# Patient Record
Sex: Female | Born: 1990 | Race: Black or African American | Hispanic: No | Marital: Single | Smoking: Former smoker
Health system: Southern US, Community
[De-identification: ages and names within clinical notes are randomized; demographics above are authoritative.]

## PROBLEM LIST (undated history)

## (undated) DIAGNOSIS — I10 Essential (primary) hypertension: Secondary | ICD-10-CM

## (undated) DIAGNOSIS — N939 Abnormal uterine and vaginal bleeding, unspecified: Secondary | ICD-10-CM

## (undated) HISTORY — DX: Essential (primary) hypertension: I10

## (undated) HISTORY — PX: NO PAST SURGERIES: SHX2092

## (undated) HISTORY — DX: Abnormal uterine and vaginal bleeding, unspecified: N93.9

---
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
## 2009-03-29 ENCOUNTER — Emergency Department: Payer: Self-pay | Admitting: Emergency Medicine

## 2009-03-31 ENCOUNTER — Emergency Department: Payer: Self-pay | Admitting: Emergency Medicine

## 2009-04-30 ENCOUNTER — Emergency Department: Payer: Self-pay | Admitting: Unknown Physician Specialty

## 2009-06-10 ENCOUNTER — Emergency Department: Payer: Self-pay | Admitting: Emergency Medicine

## 2009-07-05 ENCOUNTER — Emergency Department: Payer: Self-pay | Admitting: Emergency Medicine

## 2009-09-23 ENCOUNTER — Emergency Department: Payer: Self-pay | Admitting: Emergency Medicine

## 2010-04-29 ENCOUNTER — Emergency Department: Payer: Self-pay | Admitting: Emergency Medicine

## 2010-11-01 IMAGING — CT CT HEAD WITHOUT CONTRAST
2 series · 16 of 30 positions shown, 20 images · non-contrast
Comparison: none

REASON FOR EXAM: ha syncope
COMMENTS:   May transport without cardiac monitor

PROCEDURE:     CT  - CT HEAD WITHOUT CONTRAST  - April 30, 2009  [DATE]
RESULT:     Technique: Helical 5mm sections were obtained from the skull
base to the vertex without administration of intravenous contrast.

[Series 2: without · axial · non-contrast · 0.46mm/px · z∈[-216,-96]mm · 13 of 30 slices shown, 17 images]
[im 3/30  brain]
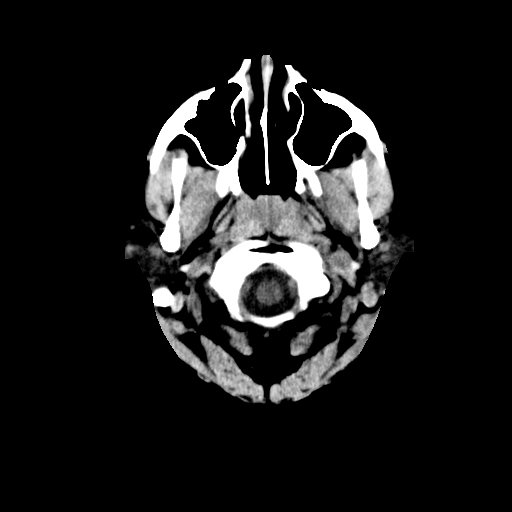
[im 3/30  bone]
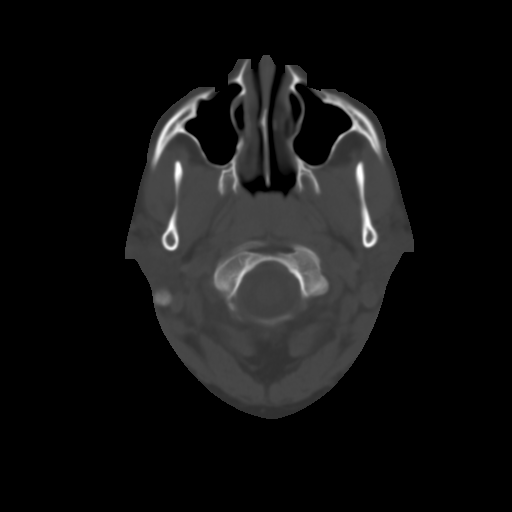
[im 5/30  brain]
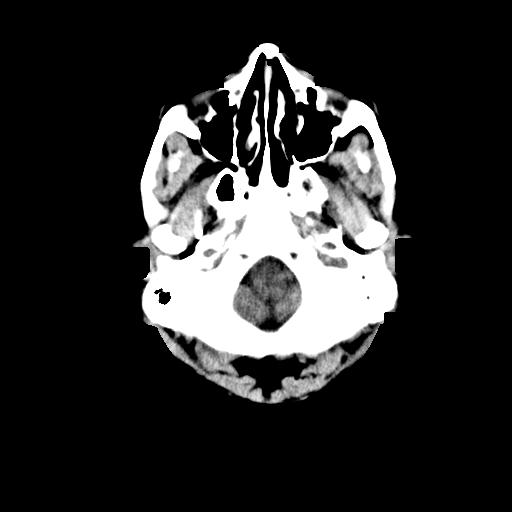
[im 7/30  brain]
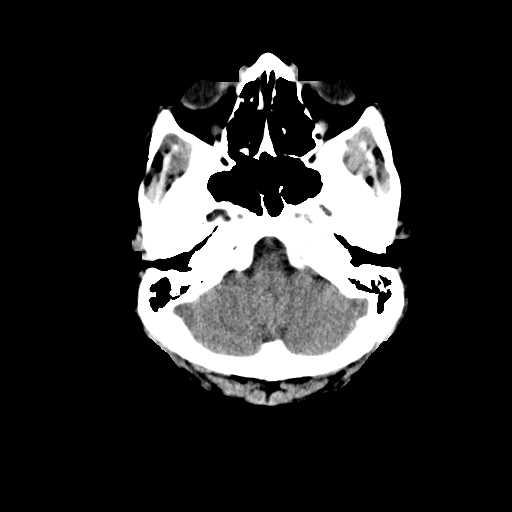
[im 9/30  brain]
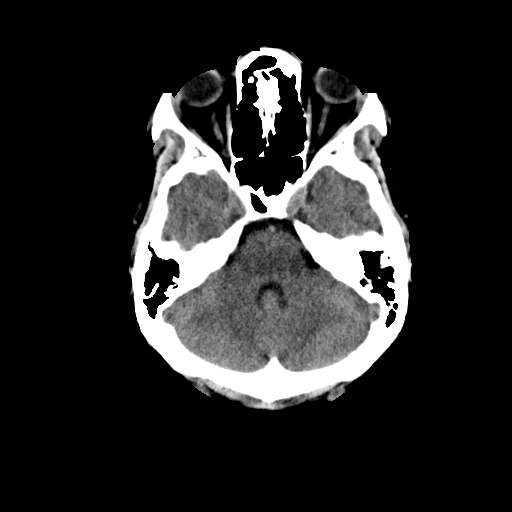
[im 11/30  brain]
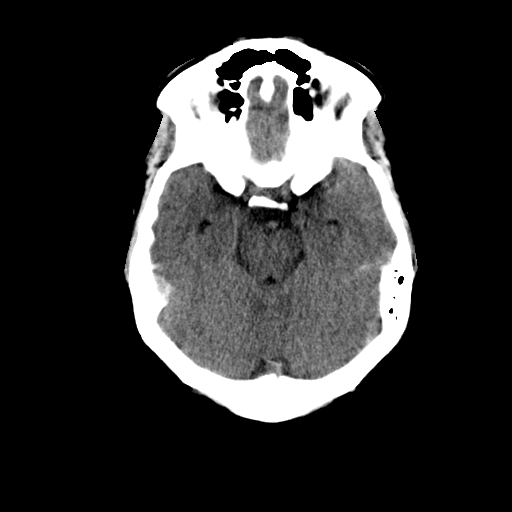
[im 11/30  bone]
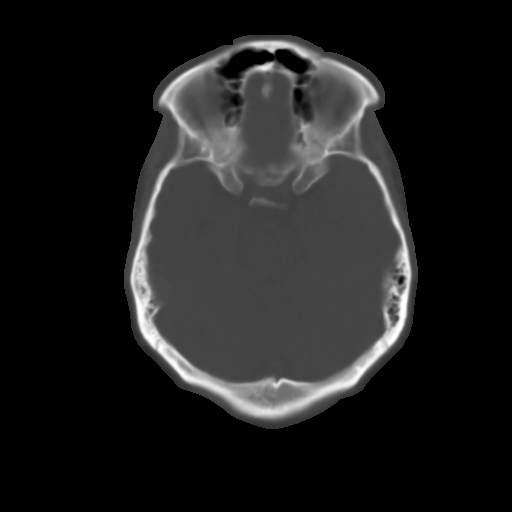
[im 13/30  brain]
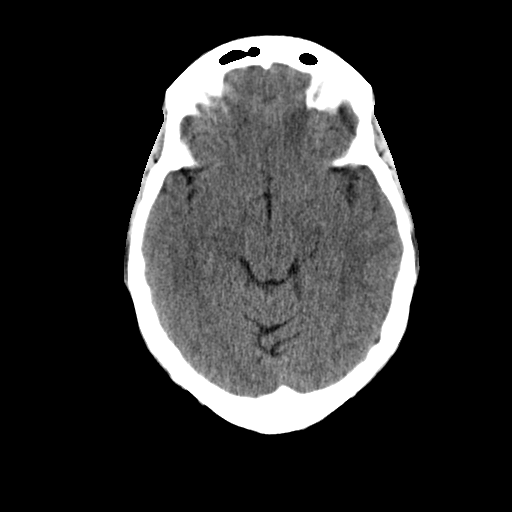
[im 15/30  brain]
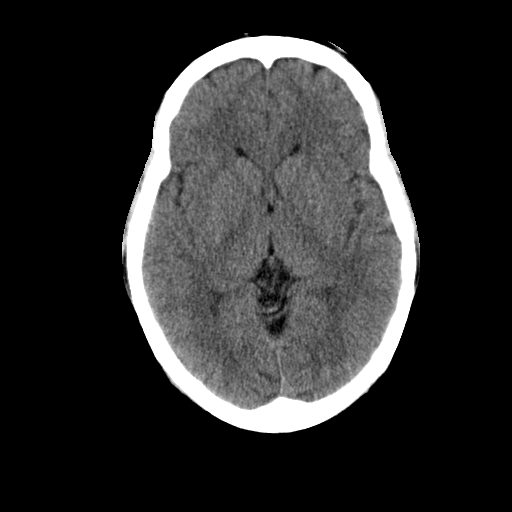
[im 17/30  brain]
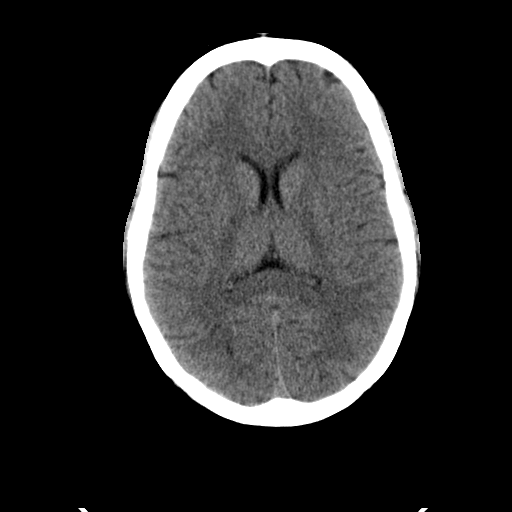
[im 19/30  brain]
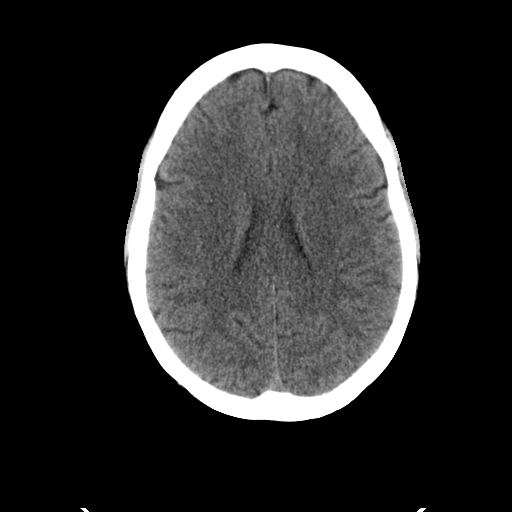
[im 19/30  bone]
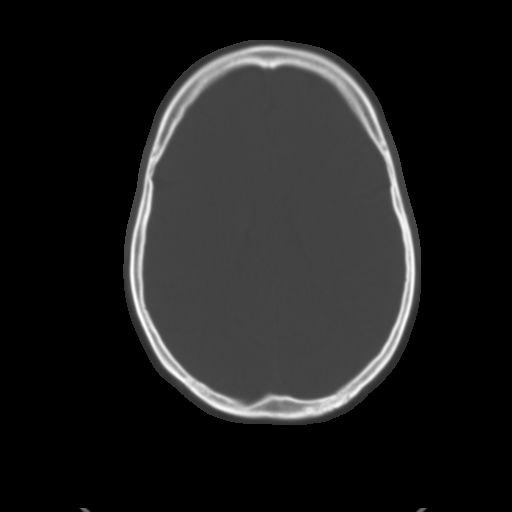
[im 21/30  brain]
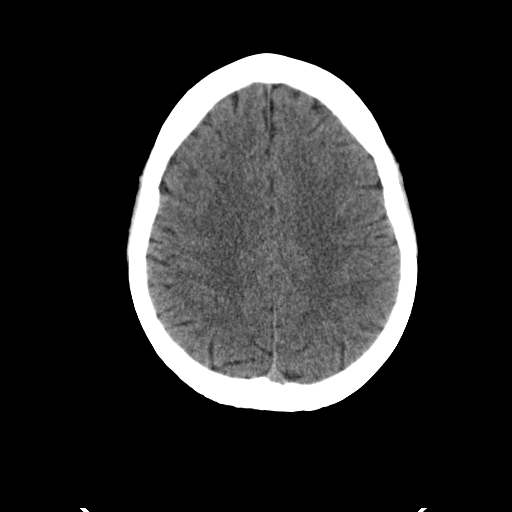
[im 23/30  brain]
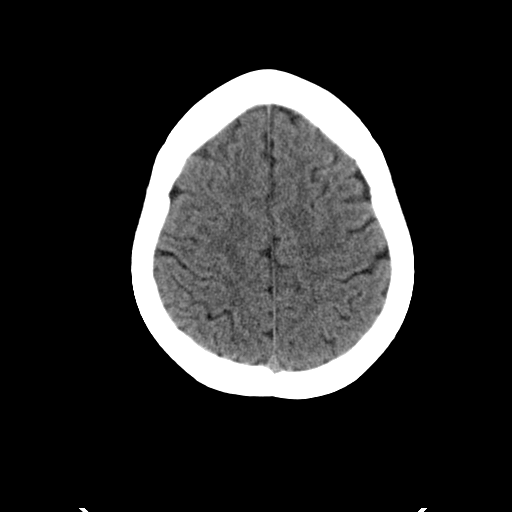
[im 25/30  brain]
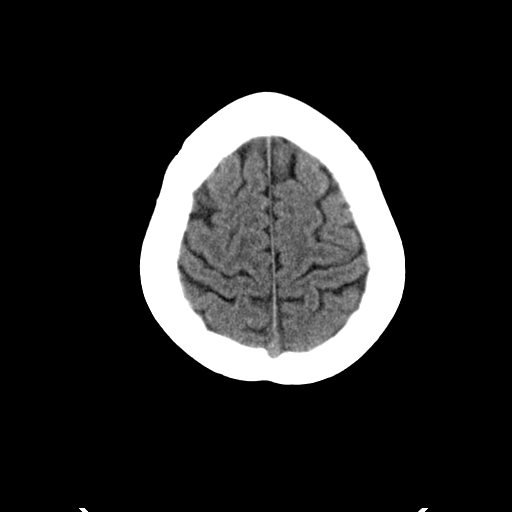
[im 27/30  brain]
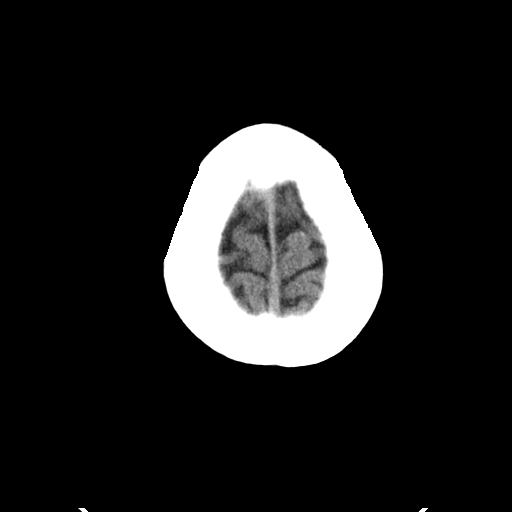
[im 27/30  bone]
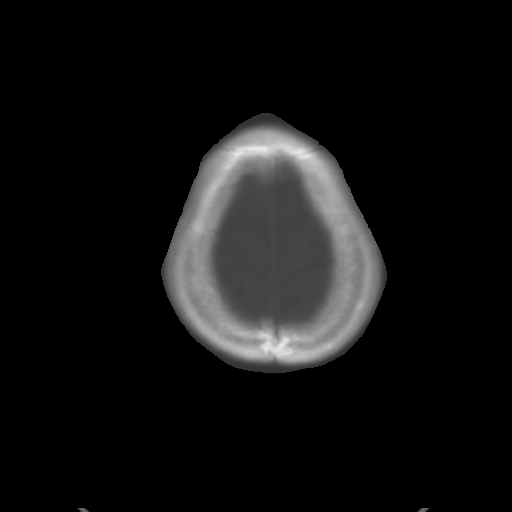

[Series 3: bone · axial · 0.46mm/px · z∈[-216,-176]mm · 3 of 30 slices shown]
[im 3/30  bone]
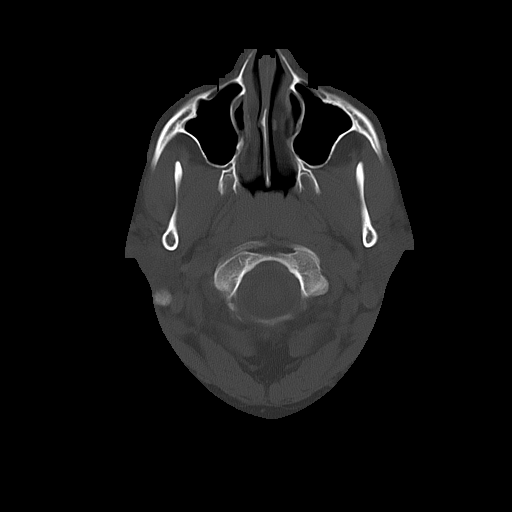
[im 7/30  bone]
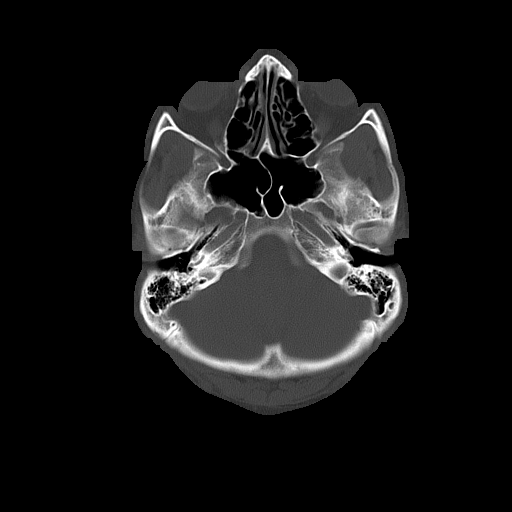
[im 11/30  bone]
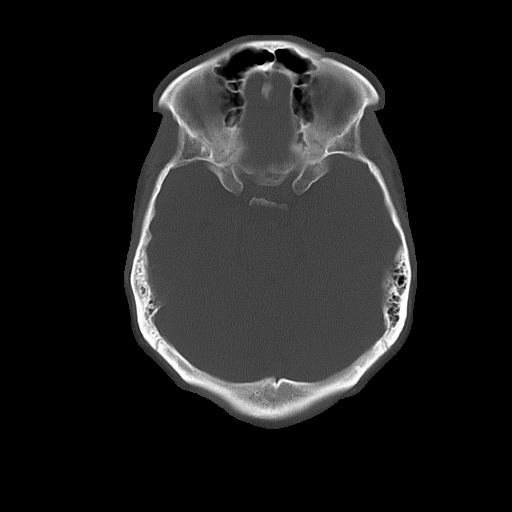

[16 of 30 positions shown; findings below may reference images not displayed]

FINDINGS: There is not evidence of intra-axial fluid collections. There is
no evidence of acute hemorrhage or secondary signs reflecting mass effect or
subacute or chronic focal territorial infarction. The osseous structures
demonstrate no evidence of a depressed skull fracture. If there is
persistent concern clinical follow-up with MRI is recommended.
IMPRESSION: 1. No evidence of acute intracranial abnormalitites.

## 2011-07-30 ENCOUNTER — Emergency Department: Payer: Self-pay | Admitting: Emergency Medicine

## 2012-08-07 ENCOUNTER — Emergency Department: Payer: Self-pay | Admitting: Emergency Medicine

## 2012-08-07 LAB — CBC
HCT: 40.6 % (ref 35.0–47.0)
HGB: 14.2 g/dL (ref 12.0–16.0)
MCH: 27.4 pg (ref 26.0–34.0)
MCHC: 34.9 g/dL (ref 32.0–36.0)
Platelet: 231 10*3/uL (ref 150–440)
WBC: 8.1 10*3/uL (ref 3.6–11.0)

## 2012-08-07 LAB — URINALYSIS, COMPLETE
Bilirubin,UR: NEGATIVE
Ketone: NEGATIVE
Nitrite: NEGATIVE
Ph: 7 (ref 4.5–8.0)
Specific Gravity: 1.013 (ref 1.003–1.030)
Squamous Epithelial: 1

## 2012-09-20 ENCOUNTER — Emergency Department: Payer: Self-pay | Admitting: Emergency Medicine

## 2013-04-18 ENCOUNTER — Emergency Department: Payer: Self-pay | Admitting: Emergency Medicine

## 2014-04-09 ENCOUNTER — Emergency Department: Payer: Self-pay | Admitting: Emergency Medicine

## 2015-07-06 ENCOUNTER — Emergency Department: Payer: No Typology Code available for payment source

## 2015-07-06 ENCOUNTER — Emergency Department
Admission: EM | Admit: 2015-07-06 | Discharge: 2015-07-06 | Disposition: A | Discharge status: Home / Self Care | Payer: No Typology Code available for payment source | Attending: Emergency Medicine | Admitting: Emergency Medicine

## 2015-07-06 ENCOUNTER — Encounter: Payer: Self-pay | Admitting: Emergency Medicine

## 2015-07-06 DIAGNOSIS — S3219XA Other fracture of sacrum, initial encounter for closed fracture: Secondary | ICD-10-CM | POA: Diagnosis not present

## 2015-07-06 DIAGNOSIS — Y9289 Other specified places as the place of occurrence of the external cause: Secondary | ICD-10-CM | POA: Diagnosis not present

## 2015-07-06 DIAGNOSIS — S322XXA Fracture of coccyx, initial encounter for closed fracture: Secondary | ICD-10-CM | POA: Insufficient documentation

## 2015-07-06 DIAGNOSIS — Y998 Other external cause status: Secondary | ICD-10-CM | POA: Insufficient documentation

## 2015-07-06 DIAGNOSIS — Y9389 Activity, other specified: Secondary | ICD-10-CM | POA: Insufficient documentation

## 2015-07-06 DIAGNOSIS — F17200 Nicotine dependence, unspecified, uncomplicated: Secondary | ICD-10-CM | POA: Insufficient documentation

## 2015-07-06 DIAGNOSIS — S3992XA Unspecified injury of lower back, initial encounter: Secondary | ICD-10-CM | POA: Diagnosis present

## 2015-07-06 DIAGNOSIS — W1839XA Other fall on same level, initial encounter: Secondary | ICD-10-CM | POA: Diagnosis not present

## 2015-07-06 DIAGNOSIS — S3210XA Unspecified fracture of sacrum, initial encounter for closed fracture: Secondary | ICD-10-CM

## 2015-07-06 MED ORDER — IBUPROFEN 800 MG PO TABS: 800.0000 mg | ORAL_TABLET | Freq: Three times a day (TID) | ORAL | Status: DC | PRN

## 2015-07-06 MED ORDER — HYDROCODONE-ACETAMINOPHEN 5-325 MG PO TABS: 1.0000 | ORAL_TABLET | ORAL | Status: DC | PRN

## 2015-07-06 NOTE — ED Notes (Signed)
States she missed a chair and fell onto floor   Having pain to tailbone

## 2015-07-06 NOTE — ED Provider Notes (Signed)
[facility] Emergency Department Provider Note  ____________________________________________  Time seen: Approximately 4:47 PM  I have reviewed the triage vital signs and the nursing notes.   HISTORY  Chief Complaint Fall    HPI Lauren Short is a 25 y.o. female presents for evaluation of tailbone pain. Patient reports that she missed the chair while sitting down and landed right on her butt. Complains of tailbone pain which is worsened when trying to step or walk or lay flat on her back. Has not taken any over-the-counter medications yet describes her pain as a 7/10.   History reviewed. No pertinent past medical history.  There are no active problems to display for this patient.   History reviewed. No pertinent past surgical history.  Current Outpatient Rx  Name  Route  Sig  Dispense  Refill  . HYDROcodone-acetaminophen (NORCO) 5-325 MG tablet   Oral   Take 1-2 tablets by mouth every 4 (four) hours as needed for moderate pain.   15 tablet   0   . ibuprofen (ADVIL,MOTRIN) 800 MG tablet   Oral   Take 1 tablet (800 mg total) by mouth every 8 (eight) hours as needed.   30 tablet   0     Allergies Review of patient's allergies indicates no known allergies.  No family history on file.  Social History Social History  Substance Use Topics  . Smoking status: Current Every Day Smoker  . Smokeless tobacco: None  . Alcohol Use: Yes    Review of Systems Constitutional: No fever/chills Genitourinary: Negative for dysuria. Musculoskeletal: Positive for tailbone pain. Skin: Negative for rash. Neurological: Negative for headaches, focal weakness or numbness.  10-point ROS otherwise negative.  ____________________________________________   PHYSICAL EXAM:  VITAL SIGNS: ED Triage Vitals  Enc Vitals Group     BP 07/06/15 1629 167/100 mmHg     Pulse Rate 07/06/15 1629 108     Resp 07/06/15 1629 20     Temp 07/06/15 1629 98.2 F (36.8  C)     Temp src --      SpO2 07/06/15 1629 98 %     Weight 07/06/15 1629 193 lb (87.544 kg)     Height 07/06/15 1629 5\' 7"  (1.702 m)     Head Cir --      Peak Flow --      Pain Score --      Pain Loc --      Pain Edu? --      Excl. in GC? --     Constitutional: Alert and oriented. Well appearing and in no acute distress. Cardiovascular: Normal rate, regular rhythm. Grossly normal heart sounds.  Good peripheral circulation. Respiratory: Normal respiratory effort.  No retractions. Lungs CTAB. Musculoskeletal: Point tenderness to the sacrum coccyx area. No ecchymosis or bruising noted. Neurologic:  Normal speech and language. No gross focal neurologic deficits are appreciated. Gait not tested secondary to pain Skin:  Skin is warm, dry and intact. No rash noted. Psychiatric: Mood and affect are normal. Speech and behavior are normal.  ____________________________________________   LABS (all labs ordered are listed, but only abnormal results are displayed)  Labs Reviewed - No data to display ____________________________________________   RADIOLOGY  Sacral fracture transverse noted ____________________________________________   PROCEDURES  Procedure(s) performed: None  Critical Care performed: No  ____________________________________________   INITIAL IMPRESSION / ASSESSMENT AND PLAN / ED COURSE  Pertinent labs & imaging results that were available during my care of the patient were reviewed  by me and considered in my medical decision making (see chart for details).  Sacrum fracture. Rx given for hydrocodone and ibuprofen 800. Encouraged use of a donut for comfort . Light duty avoid prolonged standing or sitting. Patient follow-up PCP or return to the ER with any worsening symptoms. ____________________________________________   FINAL CLINICAL IMPRESSION(S) / ED DIAGNOSES  Final diagnoses:  Sacrum and coccyx fracture, closed, initial encounter [facility])     This  chart was dictated using voice recognition software/Dragon. Despite best efforts to proofread, errors can occur which can change the meaning. Any change was purely unintentional.   Evangeline Dakin, PA-C 07/06/15 2143  Myrna Blazer, MD 07/08/15 2101

## 2015-07-06 NOTE — ED Notes (Signed)
See triage .Marland Kitchen. States she landed flat on tailbone.. Increased pain to lower back/tailbone

## 2016-05-14 ENCOUNTER — Encounter: Payer: Self-pay | Admitting: Emergency Medicine

## 2016-05-14 ENCOUNTER — Emergency Department
Admission: EM | Admit: 2016-05-14 | Discharge: 2016-05-14 | Disposition: A | Discharge status: Home / Self Care | Payer: BLUE CROSS/BLUE SHIELD | Attending: Emergency Medicine | Admitting: Emergency Medicine

## 2016-05-14 DIAGNOSIS — Z791 Long term (current) use of non-steroidal anti-inflammatories (NSAID): Secondary | ICD-10-CM | POA: Insufficient documentation

## 2016-05-14 DIAGNOSIS — R040 Epistaxis: Secondary | ICD-10-CM | POA: Diagnosis not present

## 2016-05-14 DIAGNOSIS — F17200 Nicotine dependence, unspecified, uncomplicated: Secondary | ICD-10-CM | POA: Diagnosis not present

## 2016-05-14 LAB — CBC
HCT: 36.9 % (ref 35.0–47.0)
Hemoglobin: 12.7 g/dL (ref 12.0–16.0)
MCH: 26 pg (ref 26.0–34.0)
MCHC: 34.3 g/dL (ref 32.0–36.0)
MCV: 75.8 fL — AB (ref 80.0–100.0)
PLATELETS: 262 10*3/uL (ref 150–440)
RBC: 4.87 MIL/uL (ref 3.80–5.20)
RDW: 15.9 % — AB (ref 11.5–14.5)
WBC: 10.4 10*3/uL (ref 3.6–11.0)

## 2016-05-14 LAB — BASIC METABOLIC PANEL
Anion gap: 5 (ref 5–15)
BUN: 16 mg/dL (ref 6–20)
CO2: 28 mmol/L (ref 22–32)
CREATININE: 0.78 mg/dL (ref 0.44–1.00)
Calcium: 9.3 mg/dL (ref 8.9–10.3)
Chloride: 104 mmol/L (ref 101–111)
GFR calc Af Amer: >60 mL/min (ref >60)
GLUCOSE: 82 mg/dL (ref 65–99)
POTASSIUM: 3.8 mmol/L (ref 3.5–5.1)
Sodium: 137 mmol/L (ref 135–145)

## 2016-05-14 MED ORDER — OXYMETAZOLINE HCL 0.05 % NA SOLN
1.0000 | Freq: Once | NASAL | Status: AC
  Administered 2016-05-14: 1 via NASAL
  Filled 2016-05-14 (×2): qty 15

## 2016-05-14 NOTE — ED Provider Notes (Signed)
[facility] Emergency Department Provider Note   ____________________________________________   I have reviewed the triage vital signs and the nursing notes.   HISTORY  Chief Complaint Epistaxis   History limited by: Not Limited   HPI Lauren Short is a 26 y.o. female who presents to the emergency department today because of concerns for nosebleeds. Thestarted a few days ago. She denies any trauma. She states that they have been on and off since then. The patient has noticed bleeding from both sides of the nostrils. The patient states she has had nosebleeds in the past but usually they last much less time and only come from one nostril.   No past medical history on file.  There are no active problems to display for this patient.   History reviewed. No pertinent surgical history.  Prior to Admission medications   Medication Sig Start Date End Date Taking? Authorizing Provider  HYDROcodone-acetaminophen (NORCO) 5-325 MG tablet Take 1-2 tablets by mouth every 4 (four) hours as needed for moderate pain. 07/06/15   Charmayne Sheerharles M Beers, PA-C  ibuprofen (ADVIL,MOTRIN) 800 MG tablet Take 1 tablet (800 mg total) by mouth every 8 (eight) hours as needed. 07/06/15   Evangeline Dakinharles M Beers, PA-C    Allergies Patient has no known allergies.  No family history on file.  Social History Social History  Substance Use Topics  . Smoking status: Current Every Day Smoker  . Smokeless tobacco: Not on file  . Alcohol use Yes    Review of Systems  Constitutional: Negative for fever. Cardiovascular: Negative for chest pain. Respiratory: Negative for shortness of breath. Gastrointestinal: Negative for abdominal pain, vomiting and diarrhea. Neurological: Negative for headaches, focal weakness or numbness.  10-point ROS otherwise negative.  ____________________________________________   PHYSICAL EXAM:  VITAL SIGNS: ED Triage Vitals  Enc Vitals Group     BP 05/14/16  1420 (!) 149/100     Pulse Rate 05/14/16 1420 72     Resp 05/14/16 1420 16     Temp 05/14/16 1420 98.3 F (36.8 C)     Temp Source 05/14/16 1420 Oral     SpO2 05/14/16 1420 100 %     Weight 05/14/16 1420 200 lb (90.7 kg)     Height 05/14/16 1420 5\' 7"  (1.702 m)     Head Circumference --      Peak Flow --      Pain Score 05/14/16 1421 0     Pain Loc --      Pain Edu? --      Excl. in GC? --    Constitutional: Alert and oriented. Well appearing and in no distress. Eyes: Conjunctivae are normal. Normal extraocular movements. ENT   Head: Normocephalic and atraumatic.   Nose: No congestion/rhinnorhea. Some blood noted in bilateral nares however no active bleeding appreciated.    Mouth/Throat: Mucous membranes are moist.   Neck: No stridor. Cardiovascular: Normal rate, regular rhythm.  No murmurs, rubs, or gallops.  Respiratory: Normal respiratory effort without tachypnea nor retractions. Breath sounds are clear and equal bilaterally. No wheezes/rales/rhonchi. Musculoskeletal: Normal range of motion in all extremities.  Neurologic:  Normal speech and language. No gross focal neurologic deficits are appreciated.  Skin:  Skin is warm, dry and intact. No rash noted. Psychiatric: Mood and affect are normal. Speech and behavior are normal. Patient exhibits appropriate insight and judgment.  ____________________________________________    LABS (pertinent positives/negatives)  Labs Reviewed  CBC - Abnormal; Notable for the following:  Result Value   MCV 75.8 (*)    RDW 15.9 (*)    All other components within normal limits  BASIC METABOLIC PANEL     ____________________________________________   EKG  None  ____________________________________________    RADIOLOGY  None  ____________________________________________   PROCEDURES  Procedures  ____________________________________________   INITIAL IMPRESSION / ASSESSMENT AND PLAN / ED  COURSE  Pertinent labs & imaging results that were available during my care of the patient were reviewed by me and considered in my medical decision making (see chart for details).  Patient presented to the emergency department today because of concerns for nosebleeds over the past few days. No history of trauma. Think likely patient's nosebleed secondary to dry air. No current bleeding noted at this time. Patient was instructed on using petroleum jelly. Additionally patient ENT follow-up.  ____________________________________________   FINAL CLINICAL IMPRESSION(S) / ED DIAGNOSES  Final diagnoses:  Epistaxis     Note: This dictation was prepared with Dragon dictation. Any transcriptional errors that result from this process are unintentional     Phineas Semen, MD 05/14/16 [phone]

## 2016-05-14 NOTE — Discharge Instructions (Signed)
As discussed you can use petroleum jelly (ie vasoline) to help lubricate your nostrils. Please seek medical attention for any high fevers, chest pain, shortness of breath, change in behavior, persistent vomiting, bloody stool or any other new or concerning symptoms.

## 2016-05-14 NOTE — ED Triage Notes (Addendum)
Pt reports bilateral nostril nose bleed with large clots x3 days. Pt also reports headache. Pt denies hx of the same, skin warm and dry. Nose is not bleeding at this time.

## 2016-08-20 ENCOUNTER — Encounter: Payer: Self-pay | Admitting: Advanced Practice Midwife

## 2016-08-20 ENCOUNTER — Ambulatory Visit (INDEPENDENT_AMBULATORY_CARE_PROVIDER_SITE_OTHER): Payer: BLUE CROSS/BLUE SHIELD | Admitting: Advanced Practice Midwife

## 2016-08-20 VITALS — BP 118/72 | Ht 67.0 in | Wt 194.0 lb

## 2016-08-20 DIAGNOSIS — N921 Excessive and frequent menstruation with irregular cycle: Secondary | ICD-10-CM | POA: Insufficient documentation

## 2016-08-20 MED ORDER — MEGESTROL ACETATE 40 MG PO TABS: 40.0000 mg | ORAL_TABLET | Freq: Two times a day (BID) | ORAL | 0 refills | Status: AC

## 2016-08-20 NOTE — Progress Notes (Signed)
  HPI:      [name] is a 26 y.o. G0P0000 who LMP was Patient's last menstrual period was 07/15/2016., presents today for a problem visit.  She complains of menorrhagia and metrorrhagia (no pattern) that  began about a month ago and its severity is described as moderate.  She has irregular periods from 1 month to 3 months and they are associated with mild menstrual cramping.  She has had daily bleeding since the end of March. She has another concern of wanting to conceive a pregnancy. She has not used any form of birth control for several years and has been sexually active without a pregnancy.  Previous evaluation: seen in 2014 for abnormal uterine bleeding and seen 3 times in 2017 for the same complaint. She also discussed her desire to conceive a pregnancy. Prior Diagnosis: obesity and abnormal uterine bleeding, primary infertility, anovulatory, menorrhagia. Previous Treatment: provera, megestrol.  She is sexually active with one female partner.  Contraception: none. Hx of STDs: none. She is premenopausal.  PMHx: She  has a past medical history of Abnormal uterine bleeding. Also,  has no past surgical history on file., family history is not on file.,  reports that she has been smoking.  She has never used smokeless tobacco. She reports that she drinks alcohol.  She has a current medication list which includes the following prescription(s): hydrocodone-acetaminophen, ibuprofen, and megestrol. Also, has No Known Allergies.  Review of Systems  Constitutional: Negative.   HENT: Negative.   Eyes: Negative.   Respiratory: Negative.   Cardiovascular: Negative.   Gastrointestinal: Negative.   Genitourinary: Negative.   Musculoskeletal: Negative.   Skin: Negative.   Neurological: Negative.   Endo/Heme/Allergies: Negative.   Psychiatric/Behavioral: Negative.     Objective: BP 118/72   Ht  (1.702 m)   Wt 194 lb (88 kg)   LMP 07/15/2016   BMI 30.38 kg/m  OBGyn Exam: no  exam today- only consult for heavy bleeding  ASSESSMENT/PLAN:  menorrhagia  Problem List Items Addressed This Visit      Other   Menometrorrhagia    Other Visit Diagnoses    Menorrhagia with irregular cycle    -  Primary   Relevant Medications   megestrol (MEGACE) 40 MG tablet   Other Relevant Orders   US Transvaginal Non-OB      Lauren Short, CNM

## 2016-08-28 ENCOUNTER — Other Ambulatory Visit: Payer: BLUE CROSS/BLUE SHIELD

## 2016-08-28 ENCOUNTER — Ambulatory Visit: Payer: BLUE CROSS/BLUE SHIELD | Admitting: Advanced Practice Midwife

## 2016-09-10 ENCOUNTER — Telehealth: Payer: Self-pay

## 2016-09-10 NOTE — Telephone Encounter (Signed)
Pt was given megestrol for help regulate cycles. Pt was regular during the months of November and December. Pt then started bleeding irregularly again. Pt took another round of megestrol and this time it has not seemed to help and she is still bleeding. Please advise pt. Pt aware JEG out of the office today. cb# [phone], thank you.

## 2016-09-10 NOTE — Telephone Encounter (Signed)
Pt states she was seen by JEG 2-3 wks ago and given some medication and wanted to know how long it was supposed to take to work bc she is still experiencing vaginal bleeding. Pt seen in office on 4/24 and was to RTO to have gyn u/s on 5/2 that was a no show. Left msg for pt to call back to discuss in more detail.

## 2016-10-15 NOTE — Telephone Encounter (Signed)
Attempted to call patient several times in the last month without success.

## 2017-01-16 ENCOUNTER — Ambulatory Visit (INDEPENDENT_AMBULATORY_CARE_PROVIDER_SITE_OTHER): Payer: BLUE CROSS/BLUE SHIELD | Admitting: Advanced Practice Midwife

## 2017-01-16 ENCOUNTER — Ambulatory Visit (INDEPENDENT_AMBULATORY_CARE_PROVIDER_SITE_OTHER): Payer: BLUE CROSS/BLUE SHIELD

## 2017-01-16 ENCOUNTER — Encounter: Payer: Self-pay | Admitting: Advanced Practice Midwife

## 2017-01-16 VITALS — BP 140/88 | HR 76 | Ht 67.0 in | Wt 198.0 lb

## 2017-01-16 DIAGNOSIS — N921 Excessive and frequent menstruation with irregular cycle: Secondary | ICD-10-CM | POA: Diagnosis not present

## 2017-01-16 MED ORDER — NORGESTIMATE-ETH ESTRADIOL 0.25-35 MG-MCG PO TABS: 1.0000 | ORAL_TABLET | Freq: Every day | ORAL | 11 refills | Status: DC

## 2017-01-16 NOTE — Progress Notes (Signed)
S: The patient is here for follow up imaging today after visit 5 months ago for abnormal uterine bleeding. She states her bleeding stopped for a couple of months following the medication she was prescribed in April. Since then she has had daily bleeding that is light- she only needs 1 tampon per day but changes it more frequently than that. She states that the bleeding is increased following intercourse. The results of the imaging are discussed today and the possible treatments are also discussed. The patient continues to be frustrated by her bleeding and also by her inability to conceive. She reluctantly agrees to take birth control pills at this time for regulation of the bleeding.  O: BP 140/88 (BP Location: Left Arm, Patient Position: Sitting)   Pulse 76   Ht  (1.702 m)   Wt 198 lb (89.8 kg)   BMI 31.01 kg/m    Results for Lauren Short, Lauren Short (MRN [id number]) as of 01/16/2017 13:31  Ref. Range 01/16/2017 09:15  US PELVIS TRANSVAGINAL NON-OB (TV ONLY) Unknown Rpt   Imaging results are WNL, follicles are seen in both ovaries and the right ovary is slightly enlarged by volume, endometrial thickness is 5.57 mm  A: 26 yo female with follow up ultrasound for abnormal uterine bleeding with normal findings  P: Trial of oral contraceptives for regulation of bleeding Follow up with infertility specialist  Tresea Mall, CNM

## 2017-07-16 ENCOUNTER — Ambulatory Visit (INDEPENDENT_AMBULATORY_CARE_PROVIDER_SITE_OTHER): Payer: BLUE CROSS/BLUE SHIELD | Admitting: Advanced Practice Midwife

## 2017-07-16 ENCOUNTER — Encounter: Payer: Self-pay | Admitting: Advanced Practice Midwife

## 2017-07-16 VITALS — BP 118/74 | Ht 67.0 in | Wt 201.0 lb

## 2017-07-16 DIAGNOSIS — Z8742 Personal history of other diseases of the female genital tract: Secondary | ICD-10-CM | POA: Diagnosis not present

## 2017-07-16 DIAGNOSIS — N921 Excessive and frequent menstruation with irregular cycle: Secondary | ICD-10-CM

## 2017-07-16 DIAGNOSIS — N939 Abnormal uterine and vaginal bleeding, unspecified: Secondary | ICD-10-CM | POA: Diagnosis not present

## 2017-07-16 MED ORDER — NORGESTIMATE-ETH ESTRADIOL 0.25-35 MG-MCG PO TABS: 1.0000 | ORAL_TABLET | Freq: Every day | ORAL | 6 refills | Status: DC

## 2017-07-16 NOTE — Progress Notes (Addendum)
S: The patient is here due to continued frustration over daily bleeding and inability to conceive. She wears a tampon or a pad but only has to wear 1 per day. She is not soaking them. She does notice an increase in the amount of bleeding following intercourse, although it is still light. We discussed the friable nature of the cervix. She tried the OCP I prescribed for her in September of last year but only took 1 months worth. She said the bleeding improved while she was on that and she did not realize there were refills available. She says she has had recent testing at the health department and she does not have any STDs. We did discuss the possibility of PCOS with abnormal glucose and enlarged ovaries. She admits to eating whatever she wants to eat- not necessarily healthy and she does not exercise. She admits inadequate sleep. She denies stress but states she works 2 jobs. I meant to ask her today about her tobacco use but forgot since the conversation was still more focused on her inability to conceive. I have tried calling the phone numbers that we have for her which do not work. I have written a letter asking her to let me know best phone numbers and if she is still smoking. We did discuss healthy lifestyle and how that can improve how her hormones function. We did discuss other medication treatments that can help her conceive. We did discuss other alternatives such as acupuncture and [place]igo abdominal massage as treatment for infertility. She has not wanted to follow up with an infertility specialist yet. She is not interested in Clomid at this time. She says she eats salads as evidence that she eats healthy but it causes her to have diarrhea. She is wondering if there is anything she can do differently to avoid that. The salads are from [employer] and other restaurants. I suggested that it was difficult to say due to the unknown of all the ingredients on her salads- dressing, etc. She denies IBS or  constipation. She says she has looked into taking an herbal supplement to try to conceive but she does not know the name of it. Her ultrasound imaging of uterus from September of last year was normal. Her ovaries were both greater than 10 cubic cm which could indicate PCOS. Her previous PCOS lab work from 2 years ago was normal except for elevated Hgb A1C.  After lengthy discussion she agrees to try the birth control pills again to help control the daily bleeding.   After I hear from the patient regarding her smoking status I will switch her OCP to progesterone-only as needed.   ROS is positive for seasonal allergies, hot/cold intolerance and fatigue.  Constitutional: negative for body aches, night sweats Genitourinary: negative for dysuria, frequency, urgency Psychiatric: negative for anxiety, depression, confusion  Consult with Dr Jean Rosenthal regarding POC. Will offer patient Provera challenge 10 mg x 10 days with ovulation check day 21  O:  Vital Signs: BP 118/74   Ht 5\' 7"  (1.702 m)   Wt 201 lb (91.2 kg)   LMP 05/18/2017   BMI 31.48 kg/m  Constitutional: Well nourished, well developed female in no acute distress.  HEENT: normal    Respiratory:  Normal respiratory effort Psych: Alert and Oriented x3. No memory deficits. Normal mood and affect.  A: 27 yo G0P0 with daily spotting, fertility concerns  P: Rx sent for Sprintec  Change Rx to progesterone-only pill as needed or Provera challenge Letter  mailed to patient since her phone numbers are not working Follow up as needed for further questions  25 minutes spent in face to face discussion with >50% spent in counseling, management, and coordination of care of her abnormal vaginal bleeding and infertility.  Tresea MallJane Oluwatobiloba Martin, CNM

## 2017-08-18 ENCOUNTER — Emergency Department
Admission: EM | Admit: 2017-08-18 | Discharge: 2017-08-18 | Disposition: A | Discharge status: Home / Self Care | Payer: BLUE CROSS/BLUE SHIELD | Attending: Emergency Medicine | Admitting: Emergency Medicine

## 2017-08-18 ENCOUNTER — Other Ambulatory Visit: Payer: Self-pay

## 2017-08-18 DIAGNOSIS — Z5321 Procedure and treatment not carried out due to patient leaving prior to being seen by health care provider: Secondary | ICD-10-CM | POA: Insufficient documentation

## 2017-08-18 DIAGNOSIS — R1110 Vomiting, unspecified: Secondary | ICD-10-CM | POA: Insufficient documentation

## 2017-08-18 DIAGNOSIS — R51 Headache: Secondary | ICD-10-CM | POA: Insufficient documentation

## 2017-08-18 LAB — CBC
HEMATOCRIT: 37.6 % (ref 35.0–47.0)
HEMOGLOBIN: 12.8 g/dL (ref 12.0–16.0)
MCH: 24.9 pg — AB (ref 26.0–34.0)
MCHC: 34.1 g/dL (ref 32.0–36.0)
MCV: 73 fL — ABNORMAL LOW (ref 80.0–100.0)
Platelets: 279 10*3/uL (ref 150–440)
RBC: 5.15 MIL/uL (ref 3.80–5.20)
RDW: 17.5 % — AB (ref 11.5–14.5)
WBC: 6.5 10*3/uL (ref 3.6–11.0)

## 2017-08-18 LAB — URINALYSIS, COMPLETE (UACMP) WITH MICROSCOPIC
BILIRUBIN URINE: NEGATIVE
Glucose, UA: NEGATIVE mg/dL
KETONES UR: NEGATIVE mg/dL
Leukocytes, UA: NEGATIVE
Nitrite: NEGATIVE
PH: 7 (ref 5.0–8.0)
Protein, ur: NEGATIVE mg/dL
SPECIFIC GRAVITY, URINE: 1.016 (ref 1.005–1.030)

## 2017-08-18 LAB — COMPREHENSIVE METABOLIC PANEL
ALBUMIN: 4.5 g/dL (ref 3.5–5.0)
ALK PHOS: 52 U/L (ref 38–126)
ALT: 16 U/L (ref 14–54)
ANION GAP: 9 (ref 5–15)
AST: 20 U/L (ref 15–41)
BILIRUBIN TOTAL: 0.3 mg/dL (ref 0.3–1.2)
BUN: 13 mg/dL (ref 6–20)
CALCIUM: 9 mg/dL (ref 8.9–10.3)
CO2: 24 mmol/L (ref 22–32)
Chloride: 103 mmol/L (ref 101–111)
Creatinine, Ser: 0.52 mg/dL (ref 0.44–1.00)
GFR calc Af Amer: >60 mL/min (ref >60)
GFR calc non Af Amer: >60 mL/min (ref >60)
GLUCOSE: 128 mg/dL — AB (ref 65–99)
POTASSIUM: 3.6 mmol/L (ref 3.5–5.1)
SODIUM: 136 mmol/L (ref 135–145)
Total Protein: 8.2 g/dL — ABNORMAL HIGH (ref 6.5–8.1)

## 2017-08-18 LAB — LIPASE, BLOOD: Lipase: 23 U/L (ref 11–51)

## 2017-08-18 LAB — POCT PREGNANCY, URINE: Preg Test, Ur: NEGATIVE

## 2017-08-18 MED ORDER — ONDANSETRON 4 MG PO TBDP
4.0000 mg | ORAL_TABLET | Freq: Once | ORAL | Status: AC | PRN
  Administered 2017-08-18: 4 mg via ORAL
  Filled 2017-08-18: qty 1

## 2017-08-18 NOTE — ED Notes (Signed)
Patient again up to desk asking about time.  Again explained, patient states that patients are being called back, explained to patient that other patients were being taken to x-ray and being brought back to waiting room.

## 2017-08-18 NOTE — ED Notes (Signed)
Patient's boyfriend up to desk stating that it is ridiculous to have to wait this long.  Attempted to explain that there was an emergency in the back, but patient and boyfriend verbally confrontational.  State [facility] are [facility].

## 2017-08-18 NOTE — ED Triage Notes (Addendum)
Patient has been vomiting for 2 weeks and headache for 3 days. Patient states she can not sleep with her headache being 10/10 and states she feels dizzy. Hx of headaches when BP is high.

## 2017-08-18 NOTE — ED Notes (Signed)
Patient up to desk asking how much longer.  Explained process to patient, verbalized understanding.

## 2018-07-20 ENCOUNTER — Telehealth: Payer: Self-pay | Admitting: Advanced Practice Midwife

## 2018-07-20 DIAGNOSIS — N921 Excessive and frequent menstruation with irregular cycle: Secondary | ICD-10-CM

## 2018-07-20 MED ORDER — NORGESTIMATE-ETH ESTRADIOL 0.25-35 MG-MCG PO TABS: 1.0000 | ORAL_TABLET | Freq: Every day | ORAL | 2 refills | Status: DC

## 2018-07-20 NOTE — Telephone Encounter (Signed)
Refill eRx'd.  SP to make pt aware.

## 2018-07-20 NOTE — Telephone Encounter (Signed)
Patient is schedule for annual 07/29/18 with [facility]. Patient is needing refill on birthcontrol. Please advise

## 2018-07-29 ENCOUNTER — Ambulatory Visit: Payer: BLUE CROSS/BLUE SHIELD | Admitting: Advanced Practice Midwife

## 2018-11-10 ENCOUNTER — Other Ambulatory Visit: Payer: Self-pay

## 2018-11-10 ENCOUNTER — Ambulatory Visit: Payer: Self-pay | Admitting: Obstetrics & Gynecology

## 2019-08-26 ENCOUNTER — Ambulatory Visit: Payer: Self-pay | Attending: Internal Medicine

## 2019-08-26 DIAGNOSIS — Z20822 Contact with and (suspected) exposure to covid-19: Secondary | ICD-10-CM

## 2019-08-27 LAB — SARS-COV-2, NAA 2 DAY TAT

## 2019-08-27 LAB — NOVEL CORONAVIRUS, NAA: SARS-CoV-2, NAA: NOT DETECTED

## 2020-11-21 ENCOUNTER — Other Ambulatory Visit: Payer: Self-pay

## 2020-11-21 DIAGNOSIS — N921 Excessive and frequent menstruation with irregular cycle: Secondary | ICD-10-CM

## 2020-11-21 MED ORDER — NORGESTIMATE-ETH ESTRADIOL 0.25-35 MG-MCG PO TABS: 1.0000 | ORAL_TABLET | Freq: Every day | ORAL | 0 refills | Status: DC

## 2020-11-21 NOTE — Telephone Encounter (Signed)
Pt calling for refill of bc.  [phone]  Pt aware refill eRx'd.  Msg sent to pharm for pt to schedule annual exam.

## 2020-12-14 ENCOUNTER — Encounter: Payer: Self-pay | Admitting: Emergency Medicine

## 2020-12-14 ENCOUNTER — Other Ambulatory Visit: Payer: Self-pay

## 2020-12-14 ENCOUNTER — Emergency Department: Payer: Self-pay

## 2020-12-14 ENCOUNTER — Emergency Department
Admission: EM | Admit: 2020-12-14 | Discharge: 2020-12-14 | Disposition: A | Discharge status: Home / Self Care | Payer: Self-pay | Attending: Physician Assistant | Admitting: Physician Assistant

## 2020-12-14 DIAGNOSIS — U071 COVID-19: Secondary | ICD-10-CM | POA: Insufficient documentation

## 2020-12-14 DIAGNOSIS — I10 Essential (primary) hypertension: Secondary | ICD-10-CM | POA: Insufficient documentation

## 2020-12-14 DIAGNOSIS — Z28310 Unvaccinated for covid-19: Secondary | ICD-10-CM | POA: Insufficient documentation

## 2020-12-14 DIAGNOSIS — F17200 Nicotine dependence, unspecified, uncomplicated: Secondary | ICD-10-CM | POA: Insufficient documentation

## 2020-12-14 LAB — CBC WITH DIFFERENTIAL/PLATELET
Abs Immature Granulocytes: 0.01 10*3/uL (ref 0.00–0.07)
Basophils Absolute: 0 10*3/uL (ref 0.0–0.1)
Basophils Relative: 0 %
Eosinophils Absolute: 0 10*3/uL (ref 0.0–0.5)
Eosinophils Relative: 1 %
HCT: 35.4 % — ABNORMAL LOW (ref 36.0–46.0)
Hemoglobin: 12.9 g/dL (ref 12.0–15.0)
Immature Granulocytes: 0 %
Lymphocytes Relative: 9 %
Lymphs Abs: 0.6 10*3/uL — ABNORMAL LOW (ref 0.7–4.0)
MCH: 27.7 pg (ref 26.0–34.0)
MCHC: 36.4 g/dL — ABNORMAL HIGH (ref 30.0–36.0)
MCV: 76 fL — ABNORMAL LOW (ref 80.0–100.0)
Monocytes Absolute: 0.6 10*3/uL (ref 0.1–1.0)
Monocytes Relative: 10 %
Neutro Abs: 4.9 10*3/uL (ref 1.7–7.7)
Neutrophils Relative %: 80 %
Platelets: 196 10*3/uL (ref 150–400)
RBC: 4.66 MIL/uL (ref 3.87–5.11)
RDW: 13.9 % (ref 11.5–15.5)
WBC: 6.1 10*3/uL (ref 4.0–10.5)
nRBC: 0 % (ref 0.0–0.2)

## 2020-12-14 LAB — BASIC METABOLIC PANEL
Anion gap: 10 (ref 5–15)
BUN: 13 mg/dL (ref 6–20)
CO2: 28 mmol/L (ref 22–32)
Calcium: 8.9 mg/dL (ref 8.9–10.3)
Chloride: 101 mmol/L (ref 98–111)
Creatinine, Ser: 0.94 mg/dL (ref 0.44–1.00)
GFR, Estimated: >60 mL/min (ref >60)
Glucose, Bld: 120 mg/dL — ABNORMAL HIGH (ref 70–99)
Potassium: 3.4 mmol/L — ABNORMAL LOW (ref 3.5–5.1)
Sodium: 139 mmol/L (ref 135–145)

## 2020-12-14 LAB — RESP PANEL BY RT-PCR (FLU A&B, COVID) ARPGX2
Influenza A by PCR: NEGATIVE
Influenza B by PCR: NEGATIVE
SARS Coronavirus 2 by RT PCR: POSITIVE — AB

## 2020-12-14 MED ORDER — BENZONATATE 100 MG PO CAPS: ORAL_CAPSULE | ORAL | 0 refills | Status: DC

## 2020-12-14 MED ORDER — AMLODIPINE BESYLATE 5 MG PO TABS: 5.0000 mg | ORAL_TABLET | Freq: Every day | ORAL | 2 refills | Status: DC

## 2020-12-14 MED ORDER — NIRMATRELVIR/RITONAVIR (PAXLOVID)TABLET: 3.0000 | ORAL_TABLET | Freq: Two times a day (BID) | ORAL | 0 refills | Status: AC

## 2020-12-14 NOTE — ED Notes (Signed)
See triage note  Presents with chills,body aches for couple of days  Also has had some vomiting

## 2020-12-14 NOTE — Discharge Instructions (Addendum)
You are being treated for Covid. Continue to quarantine until symptoms improve. Take the prescription meds as directed. Restart your blood pressure medicine. Follow-up with one of the community clinics to continue care.

## 2020-12-14 NOTE — ED Triage Notes (Signed)
Patient ambulatory to triage with steady gait, without difficulty or distress noted; pt reports on Monday began having chills, then Tues began having sore throat,body aches, cough & sinus congestion with nausea

## 2020-12-14 NOTE — ED Provider Notes (Signed)
[facility] Emergency Department Provider Note ____________________________________________  Time seen: 0748  I have reviewed the triage vital signs and the nursing notes.  HISTORY  Chief Complaint  Cough   HPI Lauren Short is a 30 y.o. female with a medical history of irregular menstrual bleeding and hypertension, presents to the ED for evaluation of several days of cough, congestion, and body aches.  She also notes some blood-tinged sputum this morning and several episodes of diarrhea.  She denies any known sick contacts, recent travel, bad food exposure.  She has not been recently vaccinated against COVID and/or flu.  She denies any frank fevers, chest pain, or shortness of breath.  Past Medical History:  Diagnosis Date   Abnormal uterine bleeding     Patient Active Problem List   Diagnosis Date Noted   Menometrorrhagia 08/20/2016    History reviewed. No pertinent surgical history.  Prior to Admission medications   Medication Sig Start Date End Date Taking? Authorizing Provider  amLODipine (NORVASC) 5 MG tablet Take 1 tablet (5 mg total) by mouth daily. 12/14/20 03/14/21 Yes Sally Menard, Charlesetta Ivory, PA-C  benzonatate (TESSALON PERLES) 100 MG capsule Take 1-2 tabs TID prn cough 12/14/20  Yes Brileigh Sevcik, Charlesetta Ivory, PA-C  nirmatrelvir/ritonavir EUA (PAXLOVID) 20 x 150 MG & 10 x 100MG  TABS Take 3 tablets by mouth 2 (two) times daily for 5 days. Patient GFR is >60. Take nirmatrelvir (150 mg) two tablets twice daily for 5 days and ritonavir (100 mg) one tablet twice daily for 5 days. 12/14/20 12/19/20 Yes Janda Cargo, 12/21/20, PA-C  norgestimate-ethinyl estradiol (ORTHO-CYCLEN) 0.25-35 MG-MCG tablet Take 1 tablet by mouth daily. Please schedule your annual exam. 11/21/20   11/23/20, CNM    Allergies Patient has no known allergies.  History reviewed. No pertinent family history.  Social History Social History   Tobacco Use   Smoking status:  Every Day   Smokeless tobacco: Never  Vaping Use   Vaping Use: Never used  Substance Use Topics   Alcohol use: Yes    Review of Systems  Constitutional: Negative for fever. Eyes: Negative for visual changes. ENT: Negative for sore throat. Cardiovascular: Negative for chest pain. Respiratory: Negative for shortness of breath. Gastrointestinal: Negative for abdominal pain, vomiting and diarrhea. Genitourinary: Negative for dysuria. Musculoskeletal: Negative for back pain. Skin: Negative for rash. Neurological: Negative for headaches, focal weakness or numbness. ____________________________________________  PHYSICAL EXAM:  VITAL SIGNS: ED Triage Vitals  Enc Vitals Group     BP 12/14/20 0722 (!) 157/114     Pulse Rate 12/14/20 0722 95     Resp 12/14/20 0722 18     Temp 12/14/20 0722 98.9 F (37.2 C)     Temp Source 12/14/20 0722 Oral     SpO2 12/14/20 0722 100 %     Weight 12/14/20 0700 188 lb (85.3 kg)     Height 12/14/20 0700 5\' 7"  (1.702 m)     Head Circumference --      Peak Flow --      Pain Score 12/14/20 0700 7     Pain Loc --      Pain Edu? --      Excl. in GC? --     Constitutional: Alert and oriented. Well appearing and in no distress. Head: Normocephalic and atraumatic. Eyes: Conjunctivae are normal. PERRL. Normal extraocular movements Ears: Canals clear. TMs intact bilaterally. Nose: No congestion/rhinorrhea/epistaxis. Mouth/Throat: Mucous membranes are moist. Neck: Supple. No thyromegaly. Hematological/Lymphatic/Immunological: No  cervical lymphadenopathy. Cardiovascular: Normal rate, regular rhythm. Normal distal pulses. Respiratory: Normal respiratory effort. No wheezes/rales/rhonchi. Gastrointestinal: Soft and nontender. No distention. Musculoskeletal: Nontender with normal range of motion in all extremities.  Neurologic:  Normal gait without ataxia. Normal speech and language. No gross focal neurologic deficits are appreciated. Skin:  Skin is warm,  dry and intact. No rash noted. Psychiatric: Mood and affect are normal. Patient exhibits appropriate insight and judgment. ____________________________________________    {LABS (pertinent positives/negatives)  Labs Reviewed  RESP PANEL BY RT-PCR (FLU A&B, COVID) ARPGX2 - Abnormal; Notable for the following components:      Result Value   SARS Coronavirus 2 by RT PCR POSITIVE (*)    All other components within normal limits  BASIC METABOLIC PANEL - Abnormal; Notable for the following components:   Potassium 3.4 (*)    Glucose, Bld 120 (*)    All other components within normal limits  CBC WITH DIFFERENTIAL/PLATELET - Abnormal; Notable for the following components:   HCT 35.4 (*)    MCV 76.0 (*)    MCHC 36.4 (*)    Lymphs Abs 0.6 (*)    All other components within normal limits  ___________________________________________  {EKG  ____________________________________________   RADIOLOGY Official radiology report(s): DG Chest 2 View  Result Date: 12/14/2020 CLINICAL DATA:  Hemoptysis. EXAM: CHEST - 2 VIEW COMPARISON:  None. FINDINGS: The heart size and mediastinal contours are within normal limits. Both lungs are clear. The visualized skeletal structures are unremarkable. IMPRESSION: No active cardiopulmonary disease. Electronically Signed   By: Kennith Center M.D.   On: 12/14/2020 08:50   ____________________________________________  PROCEDURES   Procedures ____________________________________________   INITIAL IMPRESSION / ASSESSMENT AND PLAN / ED COURSE  As part of my medical decision making, I reviewed the following data within the electronic MEDICAL RECORD NUMBER Labs reviewed as above, Radiograph reviewed NAD, and Notes from prior ED visits    DDX: CAP, influenza, Covid, bronchitis    Patient ED evaluation of chills, sore throat, and body aches and cough patient is evaluated for complaints in the ED, and found to have an acute respiratory infection secondary to COVID.  No  radiologic evidence of any acute intrathoracic process or ground glass opacities.  Labs are reassuring as it showed no abnormality of electrolytes, no leukocytosis, no anemia.  Patient will be discharged with a prescription for Paxlovid, Tessalon Perles, and a prescription to restart her blood pressure medicine, amlodipine.  She is encouraged to follow-up with the local community clinic or return to the ED if needed.  Lauren Short was evaluated in Emergency Department on 12/14/2020 for the symptoms described in the history of present illness. She was evaluated in the context of the global COVID-19 pandemic, which necessitated consideration that the patient might be at risk for infection with the SARS-CoV-2 virus that causes COVID-19. Institutional protocols and algorithms that pertain to the evaluation of patients at risk for COVID-19 are in a state of rapid change based on information released by regulatory bodies including the CDC and federal and state organizations. These policies and algorithms were followed during the patient's care in the ED. ____________________________________________  FINAL CLINICAL IMPRESSION(S) / ED DIAGNOSES  Final diagnoses:  COVID-19  Hypertension, uncontrolled      Selen Smucker, Charlesetta Ivory, PA-C 12/14/20 1557    Shaune Pollack, MD 12/17/20 [phone]

## 2021-02-01 ENCOUNTER — Encounter: Payer: Self-pay | Admitting: Obstetrics and Gynecology

## 2021-02-01 ENCOUNTER — Other Ambulatory Visit: Payer: Self-pay

## 2021-02-01 ENCOUNTER — Other Ambulatory Visit (HOSPITAL_COMMUNITY)
Admission: RE | Admit: 2021-02-01 | Discharge: 2021-02-01 | Disposition: A | Discharge status: Home / Self Care | Payer: Self-pay | Source: Ambulatory Visit | Attending: Obstetrics and Gynecology | Admitting: Obstetrics and Gynecology

## 2021-02-01 ENCOUNTER — Ambulatory Visit (INDEPENDENT_AMBULATORY_CARE_PROVIDER_SITE_OTHER): Payer: Self-pay | Admitting: Obstetrics and Gynecology

## 2021-02-01 VITALS — BP 170/110 | Ht 67.0 in | Wt 188.0 lb

## 2021-02-01 DIAGNOSIS — N921 Excessive and frequent menstruation with irregular cycle: Secondary | ICD-10-CM | POA: Insufficient documentation

## 2021-02-01 DIAGNOSIS — Z124 Encounter for screening for malignant neoplasm of cervix: Secondary | ICD-10-CM | POA: Insufficient documentation

## 2021-02-01 DIAGNOSIS — Z113 Encounter for screening for infections with a predominantly sexual mode of transmission: Secondary | ICD-10-CM | POA: Insufficient documentation

## 2021-02-01 MED ORDER — MEDROXYPROGESTERONE ACETATE 10 MG PO TABS: 10.0000 mg | ORAL_TABLET | Freq: Every day | ORAL | 5 refills | Status: DC

## 2021-02-01 NOTE — Progress Notes (Signed)
Obstetrics & Gynecology Office Visit   Chief Complaint:  Chief Complaint  Patient presents with   Vaginal Bleeding    Bleeding since end of April at least; waxed and wained; actually stopped Saturday; bleeds c penitration too; cramps/pain.    History of Present Illness: 30 y.o. G0P0000 female who presents with prolonged bleeding.  She is currently on Sprintec.  Since the age of 16-17 she has had periods that would last a long time (months and months).  She never really had had periods of time without menses. Since her late 1s she would be bleeding for months she'd wake up one day and her bleeding would have stopped, then she might have a couple of weeks to a month with no bleeding and then the bleeding would start again.  Her periods are not painful.  She sometimes passes clots.  She has pain with intercourse in her right lower quadrant.  The level of penetration doesn't matter.  She does not have the pain every time she has intercourse.  She notes early satiety for a couple of years.  She would throw her food up after eating too much (even though the amount of food isn't much).  She denies unintentional weight loss and denies constipation.   She has had an ultrsaound in 2018 that was essentially normal (apart from enlarged ovaries). She had a pelvic MRI in 2017 at [facility] that was normal.  Her last pap smear was years ago and it was normal.    She has a history of hypertension, but doesn't take the medication.    She is worried that she won't be able to conceive and that something is going on because she bleeds all the time.    She would like to conceive.    Past Medical History:  Diagnosis Date   Abnormal uterine bleeding    Hypertension     Past Surgical History:  Procedure Laterality Date   NO PAST SURGERIES      Gynecologic History: No LMP recorded (lmp unknown). (Menstrual status: Irregular Periods).  Obstetric History: G0P0000  Family History  Problem Relation Age of Onset    Stroke Mother        paralyzed   Hypertension Mother    Hypertension Sister    Hypertension Sister     Social History   Socioeconomic History   Marital status: Single    Spouse name: Not on file   Number of children: Not on file   Years of education: Not on file   Highest education level: Not on file  Occupational History   Not on file  Tobacco Use   Smoking status: Every Day    Types: E-cigarettes   Smokeless tobacco: Current  Vaping Use   Vaping Use: Never used  Substance and Sexual Activity   Alcohol use: Yes   Drug use: Not Currently   Sexual activity: Yes    Birth control/protection: Pill  Other Topics Concern   Not on file  Social History Narrative   Not on file   Social Determinants of Health   Financial Resource Strain: Not on file  Food Insecurity: Not on file  Transportation Needs: Not on file  Physical Activity: Not on file  Stress: Not on file  Social Connections: Not on file  Intimate Partner Violence: Not on file    No Known Allergies  Prior to Admission medications   Medication Sig Start Date End Date Taking? Authorizing Provider  amLODipine (NORVASC) 5 MG tablet Take 1  tablet (5 mg total) by mouth daily. 12/14/20 03/14/21 Yes Menshew, Charlesetta Ivory, PA-C  norgestimate-ethinyl estradiol (ORTHO-CYCLEN) 0.25-35 MG-MCG tablet Take 1 tablet by mouth daily. 07/16/17  Yes [provider]    Review of Systems  Constitutional: Negative.   HENT: Negative.    Eyes: Negative.   Respiratory: Negative.    Cardiovascular: Negative.   Gastrointestinal: Negative.   Genitourinary: Negative.   Musculoskeletal: Negative.   Skin: Negative.   Neurological: Negative.   Psychiatric/Behavioral: Negative.      Physical Exam BP (!) 170/110   Ht 5\' 7"  (1.702 m)   Wt 188 lb (85.3 kg)   LMP  (LMP Unknown) Comment: been bleeding for months - waxes and wanes; stopped Sat.  BMI 29.44 kg/m  No LMP recorded (lmp unknown). (Menstrual status: Irregular  Periods). Physical Exam Constitutional:      General: She is not in acute distress.    Appearance: Normal appearance. She is well-developed.  Genitourinary:     Vulva, bladder and urethral meatus normal.     Right Labia: No rash, tenderness, lesions, skin changes or Bartholin's cyst.    Left Labia: No tenderness, skin changes, Bartholin's cyst or rash.    No inguinal adenopathy present in the right or left side.    Pelvic Tanner Score: 5/5.     Right Adnexa: not tender, not full and no mass present.    Left Adnexa: not tender, not full and no mass present.    No cervical motion tenderness, friability, lesion or polyp.     Uterus is not enlarged, fixed or tender.     Uterus is anteverted.     No urethral tenderness or mass present.     Pelvic exam was performed with patient in the lithotomy position.  HENT:     Head: Normocephalic and atraumatic.  Eyes:     General: No scleral icterus.    Conjunctiva/sclera: Conjunctivae normal.  Cardiovascular:     Rate and Rhythm: Normal rate and regular rhythm.     Heart sounds: No murmur heard.   No friction rub. No gallop.  Pulmonary:     Effort: Pulmonary effort is normal. No respiratory distress.     Breath sounds: Normal breath sounds. No wheezing or rales.  Abdominal:     General: Bowel sounds are normal. There is no distension.     Palpations: Abdomen is soft. There is no mass.     Tenderness: There is no abdominal tenderness. There is no guarding or rebound.     Hernia: There is no hernia in the left inguinal area or right inguinal area.  Musculoskeletal:        General: Normal range of motion.     Cervical back: Normal range of motion and neck supple.  Lymphadenopathy:     Lower Body: No right inguinal adenopathy. No left inguinal adenopathy.  Neurological:     General: No focal deficit present.     Mental Status: She is alert and oriented to person, place, and time.     Cranial Nerves: No cranial nerve deficit.  Skin:     General: Skin is warm and dry.     Findings: No erythema.  Psychiatric:        Mood and Affect: Mood normal.        Behavior: Behavior normal.        Judgment: Judgment normal.    Female chaperone present for pelvic and breast  portions of the physical exam  Assessment:  30 y.o. G0P0000 female here for  1. Menorrhagia with irregular cycle   2. Pap smear for cervical cancer screening   3. Screen for STD (sexually transmitted disease)      Plan: Problem List Items Addressed This Visit   None Visit Diagnoses     Menorrhagia with irregular cycle    -  Primary   Relevant Medications   medroxyPROGESTERone (PROVERA) 10 MG tablet   Other Relevant Orders   Testosterone,Free and Total   Prolactin   TSH + free T4   17-Hydroxyprogesterone   Hgb A1c w/o eAG   Cytology - PAP   US PELVIC COMPLETE WITH TRANSVAGINAL   Pap smear for cervical cancer screening       Relevant Orders   Cytology - PAP   Screen for STD (sexually transmitted disease)       Relevant Orders   Cytology - PAP      Discussed management options for abnormal uterine bleeding including expectant, NSAIDs, tranexamic acid (Lysteda), oral progesterone (Provera, norethindrone, megace), Depo Provera, Levonorgestrel containing IUD, endometrial ablation (Novasure) or hysterectomy as definitive surgical management.  Discussed risks and benefits of each method.   Final management decision will hinge on results of patient's work up and whether an underlying etiology for the patients bleeding symptoms can be discerned.  We will conduct a basic work up examining using the PALM-COIEN classification system.  In the meantime the patient opts to trial provera while we await results of her ultrasound and labs.  Printed patient education handouts were given to the patient to review at home.  Bleeding precautions reviewed.    Thomasene Mohair, MD 02/01/2021 4:04 PM    ADDENDUM: I did not realize that the patient had no insurance. So, she  did leave the clinic without getting the above labs. Unsure whether these labs will be obtained a [facility] draw station where she can set up a payment plan.

## 2021-02-02 ENCOUNTER — Encounter: Payer: Self-pay | Admitting: Obstetrics and Gynecology

## 2021-02-05 LAB — CYTOLOGY - PAP
Chlamydia: NEGATIVE
Comment: NEGATIVE
Comment: NEGATIVE
Comment: NEGATIVE
Comment: NORMAL
Diagnosis: NEGATIVE
High risk HPV: NEGATIVE
Neisseria Gonorrhea: NEGATIVE
Trichomonas: NEGATIVE

## 2021-02-08 ENCOUNTER — Encounter: Payer: Self-pay | Admitting: Family Medicine

## 2021-02-08 ENCOUNTER — Other Ambulatory Visit: Payer: Self-pay

## 2021-02-08 ENCOUNTER — Ambulatory Visit: Payer: Self-pay | Admitting: Family Medicine

## 2021-02-08 DIAGNOSIS — Z113 Encounter for screening for infections with a predominantly sexual mode of transmission: Secondary | ICD-10-CM

## 2021-02-08 LAB — HM HIV SCREENING LAB: HM HIV Screening: NEGATIVE

## 2021-02-08 LAB — WET PREP FOR TRICH, YEAST, CLUE: Trichomonas Exam: NEGATIVE

## 2021-02-08 NOTE — Progress Notes (Signed)
Pt here for STD screening.  Wet mount results reviewed, no treatment required per Provider.  Pt declined condoms. Ramata Strothman M Torii Royse, RN  

## 2021-02-08 NOTE — Progress Notes (Signed)
[facility] Department STI clinic/screening visit  Subjective:  Lauren Short is a 30 y.o. female being seen today for an STI screening visit. The patient reports they do not have symptoms.  Patient reports that they do not desire a pregnancy in the next year.   They reported they are not interested in discussing contraception today.  No LMP recorded. (Menstrual status: Irregular Periods).   Patient has the following medical conditions:   Patient Active Problem List   Diagnosis Date Noted   Menometrorrhagia 08/20/2016    Chief Complaint  Patient presents with   SEXUALLY TRANSMITTED DISEASE    Screening    HPI  Patient reports here for screening, denies s/sx.   Last HIV test per patient/review of record was a few years ago  Patient reports last pap was 02/01/2021.   See flowsheet for further details and programmatic requirements.    The following portions of the patient's history were reviewed and updated as appropriate: allergies, current medications, past medical history, past social history, past surgical history and problem list.  Objective:  There were no vitals filed for this visit.  Physical Exam Vitals and nursing note reviewed.  Constitutional:      Appearance: Normal appearance.  HENT:     Head: Normocephalic and atraumatic.     Mouth/Throat:     Mouth: Mucous membranes are moist.     Pharynx: Oropharynx is clear. No oropharyngeal exudate or posterior oropharyngeal erythema.  Pulmonary:     Effort: Pulmonary effort is normal.  Abdominal:     General: Abdomen is flat.     Palpations: There is no mass.     Tenderness: There is no abdominal tenderness. There is no rebound.  Genitourinary:    Exam position: Lithotomy position.     Pubic Area: No rash or pubic lice.      Labia:        Right: No rash or lesion.        Left: No rash or lesion.      Vagina: Normal. No vaginal discharge, erythema, bleeding or lesions.     Cervix: No cervical  motion tenderness, discharge, friability, lesion or erythema.     Uterus: Normal.      Adnexa: Right adnexa normal and left adnexa normal.     Comments: Deferred, pt self collected  Musculoskeletal:     Cervical back: Normal range of motion and neck supple.  Lymphadenopathy:     Head:     Right side of head: No preauricular or posterior auricular adenopathy.     Left side of head: No preauricular or posterior auricular adenopathy.     Cervical: No cervical adenopathy.     Upper Body:     Right upper body: No supraclavicular or axillary adenopathy.     Left upper body: No supraclavicular or axillary adenopathy.     Lower Body: No right inguinal adenopathy. No left inguinal adenopathy.  Skin:    General: Skin is warm and dry.     Findings: No rash.  Neurological:     Mental Status: She is alert and oriented to person, place, and time.  Psychiatric:        Mood and Affect: Mood normal.        Behavior: Behavior normal.     Assessment and Plan:  MACIAH FEEBACK is a 30 y.o. female presenting to the [facility] Department for STI screening  1. Screening examination for venereal disease Patient accepted all  screenings including wet prep, oral, vaginal CT/GC and bloodwork for HIV/RPR.  Patient meets criteria for HepB screening? No. Ordered? No - does not meed critiera  Patient meets criteria for HepC screening? No. Ordered? No - does not meet criteria   Wet prep results neg    NO Treatment needed  Discussed time line for State Lab results and that patient will be called with positive results and encouraged patient to call if she had not heard in 2 weeks.  Counseled to return or seek care for continued or worsening symptoms Recommended condom use with all sex  Patient is currently using  OCPs  to prevent pregnancy.  - Chlamydia/Gonorrhea [place] [facility] - HIV [place] [facility] - WET PREP FOR TRICH, YEAST, CLUE - Syphilis Serology, [place] [facility] - Chlamydia/Gonorrhea [place]  [facility]  Return for as needed.  Future Appointments  Date Time Provider Department Center  02/12/2021  5:30 PM [facility]  02/23/2021  3:50 PM Conard Novak, MD [facility] None    Wendi Snipes, FNP

## 2021-02-12 ENCOUNTER — Ambulatory Visit: Payer: Self-pay | Attending: Obstetrics and Gynecology

## 2021-02-23 ENCOUNTER — Ambulatory Visit: Payer: Self-pay | Admitting: Obstetrics and Gynecology

## 2021-05-02 ENCOUNTER — Other Ambulatory Visit: Payer: Self-pay | Admitting: Advanced Practice Midwife

## 2021-05-03 NOTE — Telephone Encounter (Signed)
Pt calling for refill of her sprintec;  [phone]  Left detailed msg refill eRx'd and she needs to schedule her annual exam.

## 2021-05-03 NOTE — Telephone Encounter (Signed)
Thank you Rita

## 2021-05-09 ENCOUNTER — Ambulatory Visit: Payer: Self-pay | Admitting: Nurse Practitioner

## 2021-05-09 ENCOUNTER — Other Ambulatory Visit: Payer: Self-pay

## 2021-05-09 ENCOUNTER — Encounter: Payer: Self-pay | Admitting: Nurse Practitioner

## 2021-05-09 DIAGNOSIS — Z113 Encounter for screening for infections with a predominantly sexual mode of transmission: Secondary | ICD-10-CM

## 2021-05-09 DIAGNOSIS — I10 Essential (primary) hypertension: Secondary | ICD-10-CM

## 2021-05-09 LAB — WET PREP FOR TRICH, YEAST, CLUE
Trichomonas Exam: NEGATIVE
Yeast Exam: NEGATIVE

## 2021-05-09 LAB — HM HIV SCREENING LAB: HM HIV Screening: NEGATIVE

## 2021-05-09 NOTE — Progress Notes (Signed)
Patient here for STD check. Wet prep, no tx per standing orders.

## 2021-05-10 DIAGNOSIS — I10 Essential (primary) hypertension: Secondary | ICD-10-CM | POA: Insufficient documentation

## 2021-05-10 NOTE — Progress Notes (Signed)
[facility] Department  STI clinic/screening visit [address] [place] [address] [phone]  Subjective:  Lauren Short is a 31 y.o. female being seen today for an STI screening visit. The patient reports they do not have symptoms.  Patient reports that they do not desire a pregnancy in the next year.   They reported they are not interested in discussing contraception today.    No LMP recorded. (Menstrual status: Irregular Periods).   Patient has the following medical conditions:   Patient Active Problem List   Diagnosis Date Noted   Hypertension 05/10/2021   Menometrorrhagia 08/20/2016    Chief Complaint  Patient presents with   Annual Exam    HPI  Patient reports in clinic today for a routine STD screening.    Last HIV test per patient/review of record was 02/08/2021 Patient reports last pap was 02/01/2021.   Screening for MPX risk: Does the patient have an unexplained rash? No Is the patient MSM? No Does the patient endorse multiple sex partners or anonymous sex partners? No Did the patient have close or sexual contact with a person diagnosed with MPX? No Has the patient traveled outside the [place] where MPX is endemic? No Is there a high clinical suspicion for MPX-- evidenced by one of the following No  -Unlikely to be chickenpox  -Lymphadenopathy  -Rash that present in same phase of evolution on any given body part See flowsheet for further details and programmatic requirements.    The following portions of the patient's history were reviewed and updated as appropriate: allergies, current medications, past medical history, past social history, past surgical history and problem list.  Objective:  There were no vitals filed for this visit.  Physical Exam HENT:     Head: Normocephalic.     Mouth/Throat:     Comments: No visible signs of dental caries.  Patient states she has never been to a dentist.   Eyes:     Pupils: Pupils are  equal, round, and reactive to light.  Pulmonary:     Effort: Pulmonary effort is normal.  Abdominal:     General: Abdomen is flat.     Palpations: Abdomen is soft.  Genitourinary:    Comments: External genitalia/pubic area without nits, lice, edema, erythema, lesions and inguinal adenopathy. Vagina with normal mucosa and discharge. Cervix without visible lesions. Uterus firm, mobile, nt, no masses, no CMT, no adnexal tenderness or fullness. pH 4.5. Musculoskeletal:     Cervical back: Full passive range of motion without pain, normal range of motion and neck supple.  Skin:    General: Skin is warm and dry.     Comments: 1 cm healing scabbed over sore to right buttocks.    Neurological:     Mental Status: She is alert and oriented to person, place, and time.  Psychiatric:        Attention and Perception: Attention normal.        Speech: Speech normal.        Behavior: Behavior is cooperative.     Assessment and Plan:  Lauren Short is a 31 y.o. female presenting to the [facility] Department for STI screening  1. Screening examination for venereal disease -31 year old female in clinic today for STD screening.  -Patient accepted all screenings including oral, vaginal CT/GC and bloodwork for HIV/RPR.  Patient meets criteria for HepB screening? No. Ordered? No - low risk  Patient meets criteria for HepC screening? No.  Ordered? No - low risk   Treat wet prep per standing order Discussed time line for State Lab results and that patient will be called with positive results and encouraged patient to call if she had not heard in 2 weeks.  Counseled to return or seek care for continued or worsening symptoms Recommended condom use with all sex  Patient is currently using Hormonal Contraception: Injection, Rings and Patches to prevent pregnancy.   - Chlamydia/Gonorrhea [place] [facility] - HIV [place] [facility] - Syphilis Serology,  Lab - WET PREP FOR TRICH, YEAST, CLUE -  Gonococcus culture     Return if symptoms worsen or fail to improve.  No future appointments.  Glenna Fellows, FNP

## 2021-05-13 LAB — GONOCOCCUS CULTURE

## 2021-07-06 ENCOUNTER — Ambulatory Visit: Payer: BC Managed Care – PPO

## 2021-09-20 ENCOUNTER — Telehealth: Payer: Self-pay

## 2021-09-20 NOTE — Telephone Encounter (Signed)
Pt  needs appt for annual exam for more refills on your [facility]

## 2021-09-27 ENCOUNTER — Ambulatory Visit: Payer: BC Managed Care – PPO

## 2021-10-11 ENCOUNTER — Ambulatory Visit (INDEPENDENT_AMBULATORY_CARE_PROVIDER_SITE_OTHER): Payer: BC Managed Care – PPO | Admitting: Obstetrics

## 2021-10-11 ENCOUNTER — Encounter: Payer: Self-pay | Admitting: Obstetrics

## 2021-10-11 VITALS — BP 136/82 | Ht 67.0 in | Wt 196.8 lb

## 2021-10-11 DIAGNOSIS — Z6830 Body mass index (BMI) 30.0-30.9, adult: Secondary | ICD-10-CM

## 2021-10-11 DIAGNOSIS — Z3041 Encounter for surveillance of contraceptive pills: Secondary | ICD-10-CM

## 2021-10-11 DIAGNOSIS — Z113 Encounter for screening for infections with a predominantly sexual mode of transmission: Secondary | ICD-10-CM | POA: Diagnosis not present

## 2021-10-11 DIAGNOSIS — Z7289 Other problems related to lifestyle: Secondary | ICD-10-CM

## 2021-10-11 DIAGNOSIS — Z01411 Encounter for gynecological examination (general) (routine) with abnormal findings: Secondary | ICD-10-CM | POA: Diagnosis not present

## 2021-10-11 DIAGNOSIS — G4710 Hypersomnia, unspecified: Secondary | ICD-10-CM

## 2021-10-11 DIAGNOSIS — F17210 Nicotine dependence, cigarettes, uncomplicated: Secondary | ICD-10-CM

## 2021-10-11 DIAGNOSIS — Z8679 Personal history of other diseases of the circulatory system: Secondary | ICD-10-CM | POA: Diagnosis not present

## 2021-10-11 DIAGNOSIS — A749 Chlamydial infection, unspecified: Secondary | ICD-10-CM

## 2021-10-11 DIAGNOSIS — A5901 Trichomonal vulvovaginitis: Secondary | ICD-10-CM

## 2021-10-11 DIAGNOSIS — J358 Other chronic diseases of tonsils and adenoids: Secondary | ICD-10-CM

## 2021-10-11 DIAGNOSIS — N939 Abnormal uterine and vaginal bleeding, unspecified: Secondary | ICD-10-CM

## 2021-10-11 MED ORDER — NORETHINDRONE 0.35 MG PO TABS: 1.0000 | ORAL_TABLET | Freq: Every day | ORAL | 4 refills | Status: DC

## 2021-10-11 NOTE — Patient Instructions (Signed)
Have a great year! Please call with any concerns. Don't forget to wear your seatbelt everyday! If you are not signed up on MyChart, please ask [place] how to sign up for it!   In a world where you can be anything, please be kind.   Body mass index is 30.82 kg/m.  A Healthy Lifestyle: Care Instructions Your Care Instructions  A healthy lifestyle can help you feel good, stay at a healthy weight, and have plenty of energy for both work and play. A healthy lifestyle is something you can share with your whole family. A healthy lifestyle also can lower your risk for serious health problems, such as high blood pressure, heart disease, and diabetes. You can follow a few steps listed below to improve your health and the health of your family. Follow-up care is a key part of your treatment and safety. Be sure to make and go to all appointments, and call your doctor if you are having problems. It's also a good idea to know your test results and keep a list of the medicines you take. How can you care for yourself at home? Do not eat too much sugar, fat, or fast foods. You can still have dessert and treats now and then. The goal is moderation. Start small to improve your eating habits. Pay attention to portion sizes, drink less juice and soda pop, and eat more fruits and vegetables. Eat a healthy amount of food. A 3-ounce serving of meat, for example, is about the size of a deck of cards. Fill the rest of your plate with vegetables and whole grains. Limit the amount of soda and sports drinks you have every day. Drink more water when you are thirsty. Eat at least 5 servings of fruits and vegetables every day. It may seem like a lot, but it is not hard to reach this goal. A serving or helping is 1 piece of fruit, 1 cup of vegetables, or 2 cups of leafy, raw vegetables. Have an apple or some carrot sticks as an afternoon snack instead of a candy bar. Try to have fruits and/or vegetables at every meal. Make exercise  part of your daily routine. You may want to start with simple activities, such as walking, bicycling, or slow swimming. Try to be active 30 to 60 minutes every day. You do not need to do all 30 to 60 minutes all at once. For example, you can exercise 3 times a day for 10 or 20 minutes. Moderate exercise is safe for most people, but it is always a good idea to talk to your doctor before starting an exercise program. Keep moving. Mow the lawn, work in the garden, or [employer]. Take the stairs instead of the elevator at work. If you smoke, quit. People who smoke have an increased risk for heart attack, stroke, cancer, and other lung illnesses. Quitting is hard, but there are ways to boost your chance of quitting tobacco for good. Use nicotine gum, patches, or lozenges. Ask your doctor about stop-smoking programs and medicines. Keep trying. In addition to reducing your risk of diseases in the future, you will notice some benefits soon after you stop using tobacco. If you have shortness of breath or asthma symptoms, they will likely get better within a few weeks after you quit. Limit how much alcohol you drink. Moderate amounts of alcohol (up to 2 drinks a day for men, 1 drink a day for women) are okay. But drinking too much can lead to  liver problems, high blood pressure, and other health problems. Family health If you have a family, there are many things you can do together to improve your health. Eat meals together as a family as often as possible. Eat healthy foods. This includes fruits, vegetables, lean meats and dairy, and whole grains. Include your family in your fitness plan. Most people think of activities such as jogging or tennis as the way to fitness, but there are many ways you and your family can be more active. Anything that makes you breathe hard and gets your heart pumping is exercise. Here are some tips: Walk to do errands or to take your child to school or the bus. Go for a family  bike ride after dinner instead of watching TV. Care instructions adapted under license by your healthcare professional. This care instruction is for use with your licensed healthcare professional. If you have questions about a medical condition or this instruction, always ask your healthcare professional. [place] any warranty or liability for your use of this information.
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
## 2021-10-15 LAB — CHLAMYDIA/GONOCOCCUS/TRICHOMONAS, NAA
Chlamydia by NAA: POSITIVE — AB
Gonococcus by NAA: NEGATIVE
Trich vag by NAA: POSITIVE — AB

## 2021-10-15 MED ORDER — METRONIDAZOLE 500 MG PO TABS: 500.0000 mg | ORAL_TABLET | Freq: Two times a day (BID) | ORAL | 0 refills | Status: DC

## 2021-10-15 MED ORDER — DOXYCYCLINE HYCLATE 100 MG PO CAPS: 100.0000 mg | ORAL_CAPSULE | Freq: Two times a day (BID) | ORAL | 0 refills | Status: DC

## 2021-10-15 NOTE — Addendum Note (Signed)
Addended by: Althea Charon on: 10/15/2021 04:28 PM   Modules accepted: Orders

## 2021-10-16 ENCOUNTER — Telehealth: Payer: Self-pay | Admitting: Obstetrics

## 2021-10-16 NOTE — Progress Notes (Signed)
Pt aware of results and will call after 3 to schedule her follow up

## 2021-10-16 NOTE — Telephone Encounter (Signed)
Pt called in for assistance with MyChart.  She also wanted Dr. Okey Dupre to know that she is picking up medication since her test result was positive.  She also wanted Dr. Okey Dupre to know that she has an appt for the tonsil consultation set up.

## 2021-10-17 NOTE — Telephone Encounter (Signed)
Patient has been advised and plans to return back to clinic for lab draw in near future. KW

## 2021-10-17 NOTE — Telephone Encounter (Signed)
Noted recommend she return for labs ordered

## 2021-11-29 DIAGNOSIS — J358 Other chronic diseases of tonsils and adenoids: Secondary | ICD-10-CM | POA: Diagnosis not present

## 2021-11-29 DIAGNOSIS — J301 Allergic rhinitis due to pollen: Secondary | ICD-10-CM | POA: Diagnosis not present

## 2021-11-29 DIAGNOSIS — R0683 Snoring: Secondary | ICD-10-CM | POA: Diagnosis not present

## 2021-12-05 ENCOUNTER — Ambulatory Visit: Payer: BC Managed Care – PPO

## 2022-01-09 ENCOUNTER — Emergency Department
Admission: EM | Admit: 2022-01-09 | Discharge: 2022-01-09 | Disposition: A | Discharge status: Home / Self Care | Payer: BC Managed Care – PPO | Attending: Emergency Medicine | Admitting: Emergency Medicine

## 2022-01-09 ENCOUNTER — Other Ambulatory Visit: Payer: Self-pay

## 2022-01-09 ENCOUNTER — Encounter: Payer: Self-pay | Admitting: Emergency Medicine

## 2022-01-09 DIAGNOSIS — B9689 Other specified bacterial agents as the cause of diseases classified elsewhere: Secondary | ICD-10-CM | POA: Diagnosis not present

## 2022-01-09 DIAGNOSIS — E876 Hypokalemia: Secondary | ICD-10-CM | POA: Insufficient documentation

## 2022-01-09 DIAGNOSIS — L089 Local infection of the skin and subcutaneous tissue, unspecified: Secondary | ICD-10-CM | POA: Diagnosis not present

## 2022-01-09 DIAGNOSIS — Z91199 Patient's noncompliance with other medical treatment and regimen due to unspecified reason: Secondary | ICD-10-CM | POA: Diagnosis not present

## 2022-01-09 DIAGNOSIS — I10 Essential (primary) hypertension: Secondary | ICD-10-CM | POA: Diagnosis not present

## 2022-01-09 DIAGNOSIS — H05221 Edema of right orbit: Secondary | ICD-10-CM | POA: Diagnosis not present

## 2022-01-09 LAB — URINALYSIS, ROUTINE W REFLEX MICROSCOPIC
Bilirubin Urine: NEGATIVE
Glucose, UA: NEGATIVE mg/dL
Hgb urine dipstick: NEGATIVE
Ketones, ur: NEGATIVE mg/dL
Leukocytes,Ua: NEGATIVE
Nitrite: NEGATIVE
Protein, ur: NEGATIVE mg/dL
Specific Gravity, Urine: 1.018 (ref 1.005–1.030)
pH: 6 (ref 5.0–8.0)

## 2022-01-09 LAB — CBC WITH DIFFERENTIAL/PLATELET
Abs Immature Granulocytes: 0.01 10*3/uL (ref 0.00–0.07)
Basophils Absolute: 0 10*3/uL (ref 0.0–0.1)
Basophils Relative: 0 %
Eosinophils Absolute: 0.2 10*3/uL (ref 0.0–0.5)
Eosinophils Relative: 2 %
HCT: 38.8 % (ref 36.0–46.0)
Hemoglobin: 13 g/dL (ref 12.0–15.0)
Immature Granulocytes: 0 %
Lymphocytes Relative: 23 %
Lymphs Abs: 1.6 10*3/uL (ref 0.7–4.0)
MCH: 25.7 pg — ABNORMAL LOW (ref 26.0–34.0)
MCHC: 33.5 g/dL (ref 30.0–36.0)
MCV: 76.8 fL — ABNORMAL LOW (ref 80.0–100.0)
Monocytes Absolute: 0.6 10*3/uL (ref 0.1–1.0)
Monocytes Relative: 8 %
Neutro Abs: 4.6 10*3/uL (ref 1.7–7.7)
Neutrophils Relative %: 67 %
Platelets: 268 10*3/uL (ref 150–400)
RBC: 5.05 MIL/uL (ref 3.87–5.11)
RDW: 14.2 % (ref 11.5–15.5)
WBC: 7 10*3/uL (ref 4.0–10.5)
nRBC: 0 % (ref 0.0–0.2)

## 2022-01-09 LAB — BASIC METABOLIC PANEL
Anion gap: 7 (ref 5–15)
BUN: 17 mg/dL (ref 6–20)
CO2: 27 mmol/L (ref 22–32)
Calcium: 9.4 mg/dL (ref 8.9–10.3)
Chloride: 105 mmol/L (ref 98–111)
Creatinine, Ser: 0.77 mg/dL (ref 0.44–1.00)
GFR, Estimated: >60 mL/min (ref >60)
Glucose, Bld: 137 mg/dL — ABNORMAL HIGH (ref 70–99)
Potassium: 3.2 mmol/L — ABNORMAL LOW (ref 3.5–5.1)
Sodium: 139 mmol/L (ref 135–145)

## 2022-01-09 LAB — POC URINE PREG, ED: Preg Test, Ur: NEGATIVE

## 2022-01-09 MED ORDER — AMLODIPINE BESYLATE 5 MG PO TABS
5.0000 mg | ORAL_TABLET | Freq: Once | ORAL | Status: AC
  Administered 2022-01-09: 5 mg via ORAL
  Filled 2022-01-09: qty 1

## 2022-01-09 MED ORDER — CEPHALEXIN 500 MG PO CAPS: 500.0000 mg | ORAL_CAPSULE | Freq: Three times a day (TID) | ORAL | 0 refills | Status: DC

## 2022-01-09 MED ORDER — AMLODIPINE BESYLATE 5 MG PO TABS: 5.0000 mg | ORAL_TABLET | Freq: Every day | ORAL | 2 refills | Status: DC

## 2022-01-09 NOTE — ED Provider Notes (Signed)
[facility] Provider Note    Event Date/Time   First MD Initiated Contact with Patient 01/09/22 [phone]     (approximate)   History   Eye Problem   HPI  Lauren Short is a 31 y.o. female   to the ED with complaint of right infraorbital swelling with a erythematous bump that has come up.  Patient is unaware of any known insect bites or injuries in this area.  No change in her vision.  No fever, chills, nausea or vomiting.  Patient states that it has been several weeks since she has had her blood pressure medication.  She has a history of hypertension.      Physical Exam   Triage Vital Signs: ED Triage Vitals [01/09/22 0732]  Enc Vitals Group     BP      Pulse      Resp      Temp      Temp src      SpO2      Weight 200 lb (90.7 kg)     Height 5\' 7"  (1.702 m)     Head Circumference      Peak Flow      Pain Score 5     Pain Loc      Pain Edu?      Excl. in GC?     Most recent vital signs: Vitals:   01/09/22 0912 01/09/22 0945  BP: (!) 166/118 (!) 147/102  Pulse:  80  Resp:  16  Temp:    SpO2:  96%     General: Awake, no distress.  CV:  Good peripheral perfusion.  Heart regular rate and rhythm. Resp:  Normal effort.  Lungs are clear bilaterally. Abd:  No distention.  Other:  Right conjunctiva clear.  No drainage.  Right infraorbital area medial aspect there is 1 erythematous papule with tenderness.  No active drainage.  Appears to be superficial.   ED Results / Procedures / Treatments   Labs (all labs ordered are listed, but only abnormal results are displayed) Labs Reviewed  URINALYSIS, ROUTINE W REFLEX MICROSCOPIC - Abnormal; Notable for the following components:      Result Value   Color, Urine YELLOW (*)    APPearance CLEAR (*)    All other components within normal limits  CBC WITH DIFFERENTIAL/PLATELET - Abnormal; Notable for the following components:   MCV 76.8 (*)    MCH 25.7 (*)    All other components within  normal limits  BASIC METABOLIC PANEL - Abnormal; Notable for the following components:   Potassium 3.2 (*)    Glucose, Bld 137 (*)    All other components within normal limits  POC URINE PREG, ED       PROCEDURES:  Critical Care performed:   Procedures   MEDICATIONS ORDERED IN ED: Medications  amLODipine (NORVASC) tablet 5 mg (5 mg Oral Given 01/09/22 0752)     IMPRESSION / MDM / ASSESSMENT AND PLAN / ED COURSE  I reviewed the triage vital signs and the nursing notes.   Differential diagnosis includes, but is not limited to, facial abscess, dermatitis, papule facial area, hypertension uncontrolled, medically noncompliant.  31 year old female presents to the ED with a facial eruption on the right side of her face for the past 2 days.  Patient denies any fever or chills.  No visual changes.  In talking with her she has not taken her blood pressure medication in "weeks".  Initial blood pressure  was 183/112 and patient is asymptomatic.  Records indicate that she has been taking Norvasc 5 mg daily and her last blood pressure for her GYN appointment in June was normal at 136/82.  Labs were reassuring and patient was made aware that she is only minimally hypokalemic and in which she can correct this with dietary means rather than pills.  Patient is encouraged use warm moist compresses to her face frequently and begin taking Keflex 500 mg 3 times daily for infection.  If this area becomes large, having fever or chills she is return to the emergency department for reevaluation.  Also prescription for amlodipine 5 mg was sent to the pharmacy with 2 refills until she is able to obtain a PCP.  A list of clinics in the area was on her discharge papers for her to call and see who is taking new patients.  We discussed her hypertension at some length especially since her family has positive history of hypertension and that her mother also has hypertension and has suffered a CVA.      Patient's  presentation is most consistent with acute complicated illness / injury requiring diagnostic workup.  FINAL CLINICAL IMPRESSION(S) / ED DIAGNOSES   Final diagnoses:  Localized bacterial skin infection  Hypertension, uncontrolled  Hypokalemia  Medically noncompliant     Rx / DC Orders   ED Discharge Orders          Ordered    amLODipine (NORVASC) 5 MG tablet  Daily        01/09/22 0927    cephALEXin (KEFLEX) 500 MG capsule  3 times daily        01/09/22 4540             Note:  This document was prepared using Dragon voice recognition software and may include unintentional dictation errors.   Tommi Rumps, PA-C 01/09/22 1228    Concha Se, MD 01/11/22 1251

## 2022-01-09 NOTE — ED Triage Notes (Signed)
Pt via POV from home. Pt c/o R eye pain, swelling, and clear drainage for the past 2 days. No redness. No fevers. No foreign bodies. No vision changes. Pt is A&Ox4 and NAD

## 2022-01-09 NOTE — Discharge Instructions (Addendum)
Call the clinics listed on your discharge papers to see who is taking new patients and obtain a primary care provider so that you can continue your blood pressure medication without interruption.  This medication should be taken every day to help control your blood pressure.  Also decrease the amount of salt that you are eating.  On the label you will see sodium which is the same as salt and avoid foods that are high in sodium.  Also you are slightly low in potassium and a list of foods high in potassium are listed in your discharge papers.  The area on your face should clear with antibiotics.  Also use warm moist compresses to the area 4-5 times per day when possible which will help increase circulation in that area.  Should you develop any fever, chills or severe worsening of this area return to the emergency department.

## 2022-01-29 ENCOUNTER — Encounter: Payer: Self-pay | Admitting: Family Medicine

## 2022-01-29 ENCOUNTER — Ambulatory Visit: Payer: Self-pay | Admitting: Family Medicine

## 2022-01-29 DIAGNOSIS — Z202 Contact with and (suspected) exposure to infections with a predominantly sexual mode of transmission: Secondary | ICD-10-CM

## 2022-01-29 DIAGNOSIS — Z113 Encounter for screening for infections with a predominantly sexual mode of transmission: Secondary | ICD-10-CM

## 2022-01-29 LAB — WET PREP FOR TRICH, YEAST, CLUE
Trichomonas Exam: NEGATIVE
Yeast Exam: NEGATIVE

## 2022-01-29 LAB — HM HIV SCREENING LAB: HM HIV Screening: NEGATIVE

## 2022-01-29 MED ORDER — DOXYCYCLINE HYCLATE 100 MG PO TABS: 100.0000 mg | ORAL_TABLET | Freq: Two times a day (BID) | ORAL | 0 refills | Status: AC

## 2022-01-29 MED ORDER — METRONIDAZOLE 500 MG PO TABS: 500.0000 mg | ORAL_TABLET | Freq: Two times a day (BID) | ORAL | 0 refills | Status: AC

## 2022-01-29 NOTE — Progress Notes (Signed)
[facility] Department  STI clinic/screening visit Lauren Short [id number] [phone]  Subjective:  Lauren Short is a 31 y.o. female being seen today for an STI screening visit. The patient reports they do not have symptoms.  Patient reports that they do not desire a pregnancy in the next year.   They reported they are not interested in discussing contraception today.    No LMP recorded. (Menstrual status: Oral contraceptives).   Patient has the following medical conditions:   Patient Active Problem List   Diagnosis Date Noted   Hypertension 05/10/2021   Menometrorrhagia 08/20/2016    Chief Complaint  Patient presents with   SEXUALLY TRANSMITTED DISEASE    Screening and treatment for Chlamydia and Trich    HPI  Patient reports Tested/treated for CT and Trich by [facility] but then slept with partner that gave her the infections. She wants testing and retreatment.  Has had 72 female partners, does not use condoms.   Last HIV test per patient/review of record was 2023 Patient reports last pap was 2022.   Screening for MPX risk: Does the patient have an unexplained rash? No Is the patient MSM? No Does the patient endorse multiple sex partners or anonymous sex partners? No Did the patient have close or sexual contact with a person diagnosed with MPX? No Has the patient traveled outside the [place] where MPX is endemic? No Is there a high clinical suspicion for MPX-- evidenced by one of the following No  -Unlikely to be chickenpox  -Lymphadenopathy  -Rash that present in same phase of evolution on any given body part See flowsheet for further details and programmatic requirements.   Immunization history:  Immunization History  Administered Date(s) Administered   Hepatitis B 03/22/2002, 09/03/2002   Hpv-Unspecified 08/11/2008   MMR 07/30/1994   Meningococcal Conjugate 08/11/2008     The following portions of the patient's history were  reviewed and updated as appropriate: allergies, current medications, past medical history, past social history, past surgical history and problem list.  Objective:  There were no vitals filed for this visit.  Physical Exam Vitals and nursing note reviewed.  Constitutional:      Appearance: Normal appearance.  HENT:     Head: Normocephalic and atraumatic.     Mouth/Throat:     Mouth: Mucous membranes are moist.     Pharynx: Oropharynx is clear. No oropharyngeal exudate or posterior oropharyngeal erythema.  Pulmonary:     Effort: Pulmonary effort is normal.  Abdominal:     General: Abdomen is flat.     Palpations: There is no mass.     Tenderness: There is no abdominal tenderness. There is no rebound.  Genitourinary:    General: Normal vulva.     Exam position: Lithotomy position.     Pubic Area: No rash or pubic lice.      Labia:        Right: No rash or lesion.        Left: No rash or lesion.      Vagina: Normal. No vaginal discharge, erythema, bleeding or lesions.     Cervix: No cervical motion tenderness, discharge, friability, lesion or erythema.     Uterus: Normal.      Adnexa: Right adnexa normal and left adnexa normal.     Rectum: Normal.     Comments: pH = >4.5 Lymphadenopathy:     Head:     Right side of head: No preauricular or posterior  auricular adenopathy.     Left side of head: No preauricular or posterior auricular adenopathy.     Cervical: No cervical adenopathy.     Upper Body:     Right upper body: No supraclavicular, axillary or epitrochlear adenopathy.     Left upper body: No supraclavicular, axillary or epitrochlear adenopathy.     Lower Body: No right inguinal adenopathy. No left inguinal adenopathy.  Skin:    General: Skin is warm and dry.     Findings: No rash.  Neurological:     Mental Status: She is alert and oriented to person, place, and time.      Assessment and Plan:  Lauren Short is a 31 y.o. female presenting to the [facility] Department for STI screening  1. Screening examination for venereal disease Patient accepted all screenings including  vaginal CT/GC and bloodwork for HIV/RPR.  Patient meets criteria for HepB screening? No. Ordered? No - NA Patient meets criteria for HepC screening? Yes. Ordered? Yes  Treat wet prep per standing order Discussed time line for State Lab results and that patient will be called with positive results and encouraged patient to call if she had not heard in 2 weeks.  Counseled to return or seek care for continued or worsening symptoms Recommended condom use with all sex  Patient is currently using Hormonal Contraception: Injection, Rings and Patches to prevent pregnancy.   - Chlamydia/Gonorrhea [place] [facility] - Syphilis Serology, [place] [facility] - HIV [place] [facility] - WET PREP FOR [place], YEAST, CLUE  2. Chlamydia contact Was treated in June but then had sex with untreated partner Will retreat empirically Shared decision making with client about retreatment vs awaiting results.   3. Trichomonas contact See above     Return Immunizations.  No future appointments.  Caren Macadam, MD

## 2022-01-29 NOTE — Progress Notes (Signed)
Pt is here for STD screening and treatment for Chlamydia and Trich.  Wet mount results reviewed.  The patient was dispensed Metronidazole 500 mg #14 and Doxycycline 100 mg # 100 today. I provided counseling today regarding the medication. We discussed the medication, the side effects and when to call clinic. Patient given the opportunity to ask questions. Questions answered.  Condoms given.  Windle Guard, RN

## 2022-03-19 ENCOUNTER — Ambulatory Visit: Payer: BC Managed Care – PPO | Admitting: Obstetrics and Gynecology

## 2022-03-19 DIAGNOSIS — Z3009 Encounter for other general counseling and advice on contraception: Secondary | ICD-10-CM

## 2022-04-26 ENCOUNTER — Other Ambulatory Visit: Payer: Self-pay

## 2022-04-26 ENCOUNTER — Emergency Department
Admission: EM | Admit: 2022-04-26 | Discharge: 2022-04-26 | Disposition: A | Discharge status: Home / Self Care | Payer: BC Managed Care – PPO | Attending: Emergency Medicine | Admitting: Emergency Medicine

## 2022-04-26 ENCOUNTER — Encounter: Payer: Self-pay | Admitting: Emergency Medicine

## 2022-04-26 DIAGNOSIS — I10 Essential (primary) hypertension: Secondary | ICD-10-CM | POA: Insufficient documentation

## 2022-04-26 DIAGNOSIS — Z20822 Contact with and (suspected) exposure to covid-19: Secondary | ICD-10-CM | POA: Insufficient documentation

## 2022-04-26 DIAGNOSIS — J020 Streptococcal pharyngitis: Secondary | ICD-10-CM | POA: Insufficient documentation

## 2022-04-26 DIAGNOSIS — J029 Acute pharyngitis, unspecified: Secondary | ICD-10-CM | POA: Diagnosis not present

## 2022-04-26 LAB — RESP PANEL BY RT-PCR (RSV, FLU A&B, COVID)  RVPGX2
Influenza A by PCR: NEGATIVE
Influenza B by PCR: NEGATIVE
Resp Syncytial Virus by PCR: NEGATIVE
SARS Coronavirus 2 by RT PCR: NEGATIVE

## 2022-04-26 LAB — GROUP A STREP BY PCR: Group A Strep by PCR: DETECTED — AB

## 2022-04-26 MED ORDER — AMOXICILLIN-POT CLAVULANATE 875-125 MG PO TABS: 1.0000 | ORAL_TABLET | Freq: Two times a day (BID) | ORAL | 0 refills | Status: AC

## 2022-04-26 MED ORDER — IBUPROFEN 800 MG PO TABS
800.0000 mg | ORAL_TABLET | Freq: Once | ORAL | Status: AC
  Administered 2022-04-26: 800 mg via ORAL
  Filled 2022-04-26: qty 1

## 2022-04-26 NOTE — Discharge Instructions (Addendum)
Take the antibiotics as prescribed. Return for any new, worsening, or change in symptoms or other concerns. It was a pleasure caring for you today. 

## 2022-04-26 NOTE — ED Provider Notes (Signed)
[facility] Provider Note    Event Date/Time   First MD Initiated Contact with Patient 04/26/22 [phone]     (approximate)   History   Sore Throat   HPI  Lauren Short is a 31 y.o. female with a past medical history of hypertension who presents today for evaluation of sore throat, fever, body aches for the past 2 days.  She reports that she went to a "facility" where she was exposed to many sick people.  She has not taken anything for her symptoms.  She denies any trouble swallowing or opening her mouth.  She denies any neck pain.  No cough.  Patient Active Problem List   Diagnosis Date Noted   Hypertension 05/10/2021   Menometrorrhagia 08/20/2016          Physical Exam   Triage Vital Signs: ED Triage Vitals  Enc Vitals Group     BP 04/26/22 0151 (!) 189/132     Pulse Rate 04/26/22 0151 89     Resp 04/26/22 0151 20     Temp 04/26/22 0151 (!) 100.5 F (38.1 C)     Temp Source 04/26/22 0151 Oral     SpO2 04/26/22 0151 99 %     Weight 04/26/22 0139 210 lb (95.3 kg)     Height 04/26/22 0139 5\' 7"  (1.702 m)     Head Circumference --      Peak Flow --      Pain Score 04/26/22 0139 7     Pain Loc --      Pain Edu? --      Excl. in GC? --     Most recent vital signs: Vitals:   04/26/22 0151  BP: (!) 189/132  Pulse: 89  Resp: 20  Temp: (!) 100.5 F (38.1 C)  SpO2: 99%    Physical Exam Vitals and nursing note reviewed.  Constitutional:      General: Awake and alert. No acute distress.    Appearance: Normal appearance. The patient is normal weight.  HENT:     Head: Normocephalic and atraumatic.     Mouth: Mucous membranes are moist. Uvula midline.  Bilateral tonsillar exudate.  No soft palate fluctuance.  No trismus.  No voice change.  No sublingual swelling.  No tender cervical lymphadenopathy.  No nuchal rigidity.  No drooling Eyes:     General: PERRL. Normal EOMs        Right eye: No discharge.        Left eye: No discharge.      Conjunctiva/sclera: Conjunctivae normal.  Cardiovascular:     Rate and Rhythm: Normal rate and regular rhythm.     Pulses: Normal pulses.  Pulmonary:     Effort: Pulmonary effort is normal. No respiratory distress.     Breath sounds: Normal breath sounds.  Abdominal:     Abdomen is soft. There is no abdominal tenderness. No rebound or guarding. No distention. Musculoskeletal:        General: No swelling. Normal range of motion.     Cervical back: Normal range of motion and neck supple.  Skin:    General: Skin is warm and dry.     Capillary Refill: Capillary refill takes less than 2 seconds.     Findings: No rash.  Neurological:     Mental Status: The patient is awake and alert.      ED Results / Procedures / Treatments   Labs (all labs ordered are listed, but only  abnormal results are displayed) Labs Reviewed  GROUP A STREP BY PCR - Abnormal; Notable for the following components:      Result Value   Group A Strep by PCR DETECTED (*)    All other components within normal limits  RESP PANEL BY RT-PCR (RSV, FLU A&B, COVID)  [id number]     EKG     RADIOLOGY     PROCEDURES:  Critical Care performed:   Procedures   MEDICATIONS ORDERED IN ED: Medications  ibuprofen (ADVIL) tablet 800 mg (800 mg Oral Given 04/26/22 0735)     IMPRESSION / MDM / ASSESSMENT AND PLAN / ED COURSE  I reviewed the triage vital signs and the nursing notes.   Differential diagnosis includes, but is not limited to, strep pharyngitis, influenza, COVID-19, viral pharyngitis.  Patient is awake and alert, febrile to 100.5 F on arrival, though not tachycardic and has a normal oxygen saturation of 99% on room air.  Swabs obtained in triage are positive for strep pharyngitis.  Her uvula is midline, no voice change, no trismus, no nuchal rigidity to suggest peritonsillar or retropharyngeal abscess.  She is handling secretions without difficulty.  She was started on Augmentin.  We discussed the  importance of completing the antibiotic treatment even if she begins to feel better.  Also discussed that she is contagious to others.  We discussed return precautions and the importance of close outpatient follow-up.  Patient understands and agrees with plan.  She was discharged in stable condition.   Patient's presentation is most consistent with acute complicated illness / injury requiring diagnostic workup.       FINAL CLINICAL IMPRESSION(S) / ED DIAGNOSES   Final diagnoses:  Strep pharyngitis     Rx / DC Orders   ED Discharge Orders          Ordered    amoxicillin-clavulanate (AUGMENTIN) 875-125 MG tablet  2 times daily        04/26/22 0723             Note:  This document was prepared using Dragon voice recognition software and may include unintentional dictation errors.   Jackelyn Hoehn, PA-C 04/26/22 0744    Jene Every, MD 04/26/22 [phone]

## 2022-04-26 NOTE — ED Triage Notes (Signed)
Patient ambulatory to triage with steady gait, without difficulty or distress noted; pt reports since yesterday having sore throat, cough, congestion

## 2022-05-06 ENCOUNTER — Ambulatory Visit: Payer: Self-pay | Admitting: Nurse Practitioner

## 2022-05-06 ENCOUNTER — Encounter: Payer: Self-pay | Admitting: Nurse Practitioner

## 2022-05-06 DIAGNOSIS — Z113 Encounter for screening for infections with a predominantly sexual mode of transmission: Secondary | ICD-10-CM

## 2022-05-06 DIAGNOSIS — A599 Trichomoniasis, unspecified: Secondary | ICD-10-CM

## 2022-05-06 LAB — WET PREP FOR TRICH, YEAST, CLUE
Trichomonas Exam: POSITIVE — AB
Yeast Exam: NEGATIVE

## 2022-05-06 LAB — HM HIV SCREENING LAB: HM HIV Screening: NEGATIVE

## 2022-05-06 LAB — HEPATITIS B SURFACE ANTIGEN: Hepatitis B Surface Ag: NONREACTIVE

## 2022-05-06 LAB — HM HEPATITIS C SCREENING LAB: HM Hepatitis Screen: NEGATIVE

## 2022-05-06 MED ORDER — METRONIDAZOLE 500 MG PO TABS: 500.0000 mg | ORAL_TABLET | Freq: Two times a day (BID) | ORAL | 0 refills | Status: DC

## 2022-05-06 NOTE — Progress Notes (Signed)
[facility] Department  STI clinic/screening visit [place] [address] [phone]  Subjective:  Lauren Short is a 32 y.o. female being seen today for an STI screening visit. The patient reports they do not have symptoms.  Patient reports that they do not desire a pregnancy in the next year.   They reported they are not interested in discussing contraception today.  Patient currently takes OCPs  Patient's last menstrual period was 05/02/2022.   Patient has the following medical conditions:   Patient Active Problem List   Diagnosis Date Noted   Hypertension 05/10/2021   Menometrorrhagia 08/20/2016    Chief Complaint  Patient presents with   SEXUALLY TRANSMITTED DISEASE    Screening- patient is not having any symptoms     HPI  Patient reports to clinic today for STD screening.  Patient is currently asymptomatic.   Does the patient using douching products? No  Last HIV test per patient/review of record was  Lab Results  Component Value Date   HMHIVSCREEN Negative - Validated 01/29/2022   Patient reports last pap was  Lab Results  Component Value Date   DIAGPAP  02/01/2021    - Negative for intraepithelial lesion or malignancy (NILM)     Screening for MPX risk: Does the patient have an unexplained rash? No Is the patient MSM? No Does the patient endorse multiple sex partners or anonymous sex partners? Yes Did the patient have close or sexual contact with a person diagnosed with MPX? No Has the patient traveled outside the [place] where MPX is endemic? No Is there a high clinical suspicion for MPX-- evidenced by one of the following No  -Unlikely to be chickenpox  -Lymphadenopathy  -Rash that present in same phase of evolution on any given body part See flowsheet for further details and programmatic requirements.   Immunization history:  Immunization History  Administered Date(s) Administered   Hepatitis B 03/22/2002,  09/03/2002   Hpv-Unspecified 08/11/2008   MMR 07/30/1994   Meningococcal Conjugate 08/11/2008     The following portions of the patient's history were reviewed and updated as appropriate: allergies, current medications, past medical history, past social history, past surgical history and problem list.  Objective:  There were no vitals filed for this visit.  Physical Exam Constitutional:      Appearance: Normal appearance.  HENT:     Head: Normocephalic. No abrasion, masses or laceration. Hair is normal.     Right Ear: External ear normal.     Left Ear: External ear normal.     Nose: Nose normal.     Mouth/Throat:     Lips: Pink.     Mouth: Mucous membranes are moist. No oral lesions.     Pharynx: No oropharyngeal exudate or posterior oropharyngeal erythema.     Tonsils: No tonsillar exudate or tonsillar abscesses.  Eyes:     General: Lids are normal.        Right eye: No discharge.        Left eye: No discharge.     Conjunctiva/sclera: Conjunctivae normal.     Right eye: No exudate.    Left eye: No exudate. Abdominal:     General: Abdomen is flat.     Palpations: Abdomen is soft.     Tenderness: There is no abdominal tenderness. There is no rebound.  Genitourinary:    Pubic Area: No rash or pubic lice.      Labia:  Right: No rash, tenderness, lesion or injury.        Left: No rash, tenderness, lesion or injury.      Vagina: Normal. No vaginal discharge, erythema or lesions.     Cervix: No cervical motion tenderness, discharge, lesion or erythema.     Uterus: Not enlarged and not tender.      Rectum: Normal.     Comments: Amount Discharge: small  Odor: No pH: greater than 4.5 Adheres to vaginal wall: No Color: dark red, patient spotting   Musculoskeletal:     Cervical back: Full passive range of motion without pain, normal range of motion and neck supple.  Lymphadenopathy:     Cervical: No cervical adenopathy.     Right cervical: No superficial, deep or  posterior cervical adenopathy.    Left cervical: No superficial, deep or posterior cervical adenopathy.     Upper Body:     Right upper body: No supraclavicular, axillary or epitrochlear adenopathy.     Left upper body: No supraclavicular, axillary or epitrochlear adenopathy.     Lower Body: No right inguinal adenopathy. No left inguinal adenopathy.  Skin:    General: Skin is warm and dry.     Findings: No lesion or rash.  Neurological:     Mental Status: She is alert and oriented to person, place, and time.  Psychiatric:        Attention and Perception: Attention normal.        Mood and Affect: Mood normal.        Speech: Speech normal.        Behavior: Behavior normal. Behavior is cooperative.      Assessment and Plan:  Lauren Short is a 32 y.o. female presenting to the [facility] Department for STI screening  1. Screening examination for venereal disease -32 year old female in clinic today for STD screening. -Patient accepted all screenings including oral and rectal GC, vaginal CT/GC, wet prep and bloodwork for HIV/RPR, and wet prep. Patient meets criteria for HepB screening? Yes. Ordered? Yes Patient meets criteria for HepC screening? Yes. Ordered? Yes  Treat wet prep per standing order Discussed time line for State Lab results and that patient will be called with positive results and encouraged patient to call if she had not heard in 2 weeks.  Counseled to return or seek care for continued or worsening symptoms Recommended condom use with all sex  Patient is currently using Hormonal Contraception: Injection, Rings and Patches to prevent pregnancy.    - HIV/HCV [place] [facility] - Syphilis Serology, [place] [facility] - HBV Antigen/Antibody [facility] - Chlamydia/Gonorrhea [place] [facility] - Gonococcus culture - Gonococcus culture - WET PREP FOR TRICH, YEAST, CLUE  2. Trichimoniasis -treat patient today for Trich.  Advised no sex for 7 days and 7 days after partner  is treated.  - metroNIDAZOLE (FLAGYL) 500 MG tablet; Take 1 tablet (500 mg total) by mouth 2 (two) times daily.  Dispense: 14 tablet; Refill: 0   Total time spent: 30 minutes   Return if symptoms worsen or fail to improve.  Glenna Fellows, FNP

## 2022-05-06 NOTE — Progress Notes (Signed)
Pt here for STI screening.  Wet mount result reviewed with patient.  Pt positive for Trich.  Metronidazole 500mg  #14 1 po BID x 7 days dispensed to pt.  Pt counseled on medication, side effects, plan of care and when to contact clinic for questions or concerns.  Pt verbalizes understanding of above.  Condoms declined.-Forest Becker, RN

## 2022-05-11 LAB — GONOCOCCUS CULTURE

## 2022-06-17 IMAGING — CR DG CHEST 2V
1 series · 2 of 2 positions shown · non-contrast
Comparison: None.

CLINICAL DATA: Hemoptysis.

EXAM:
CHEST - 2 VIEW

[Series 1: dg chest 2 view · 0.14mm/px · 2 of 2 slices shown]
[im 1/2]
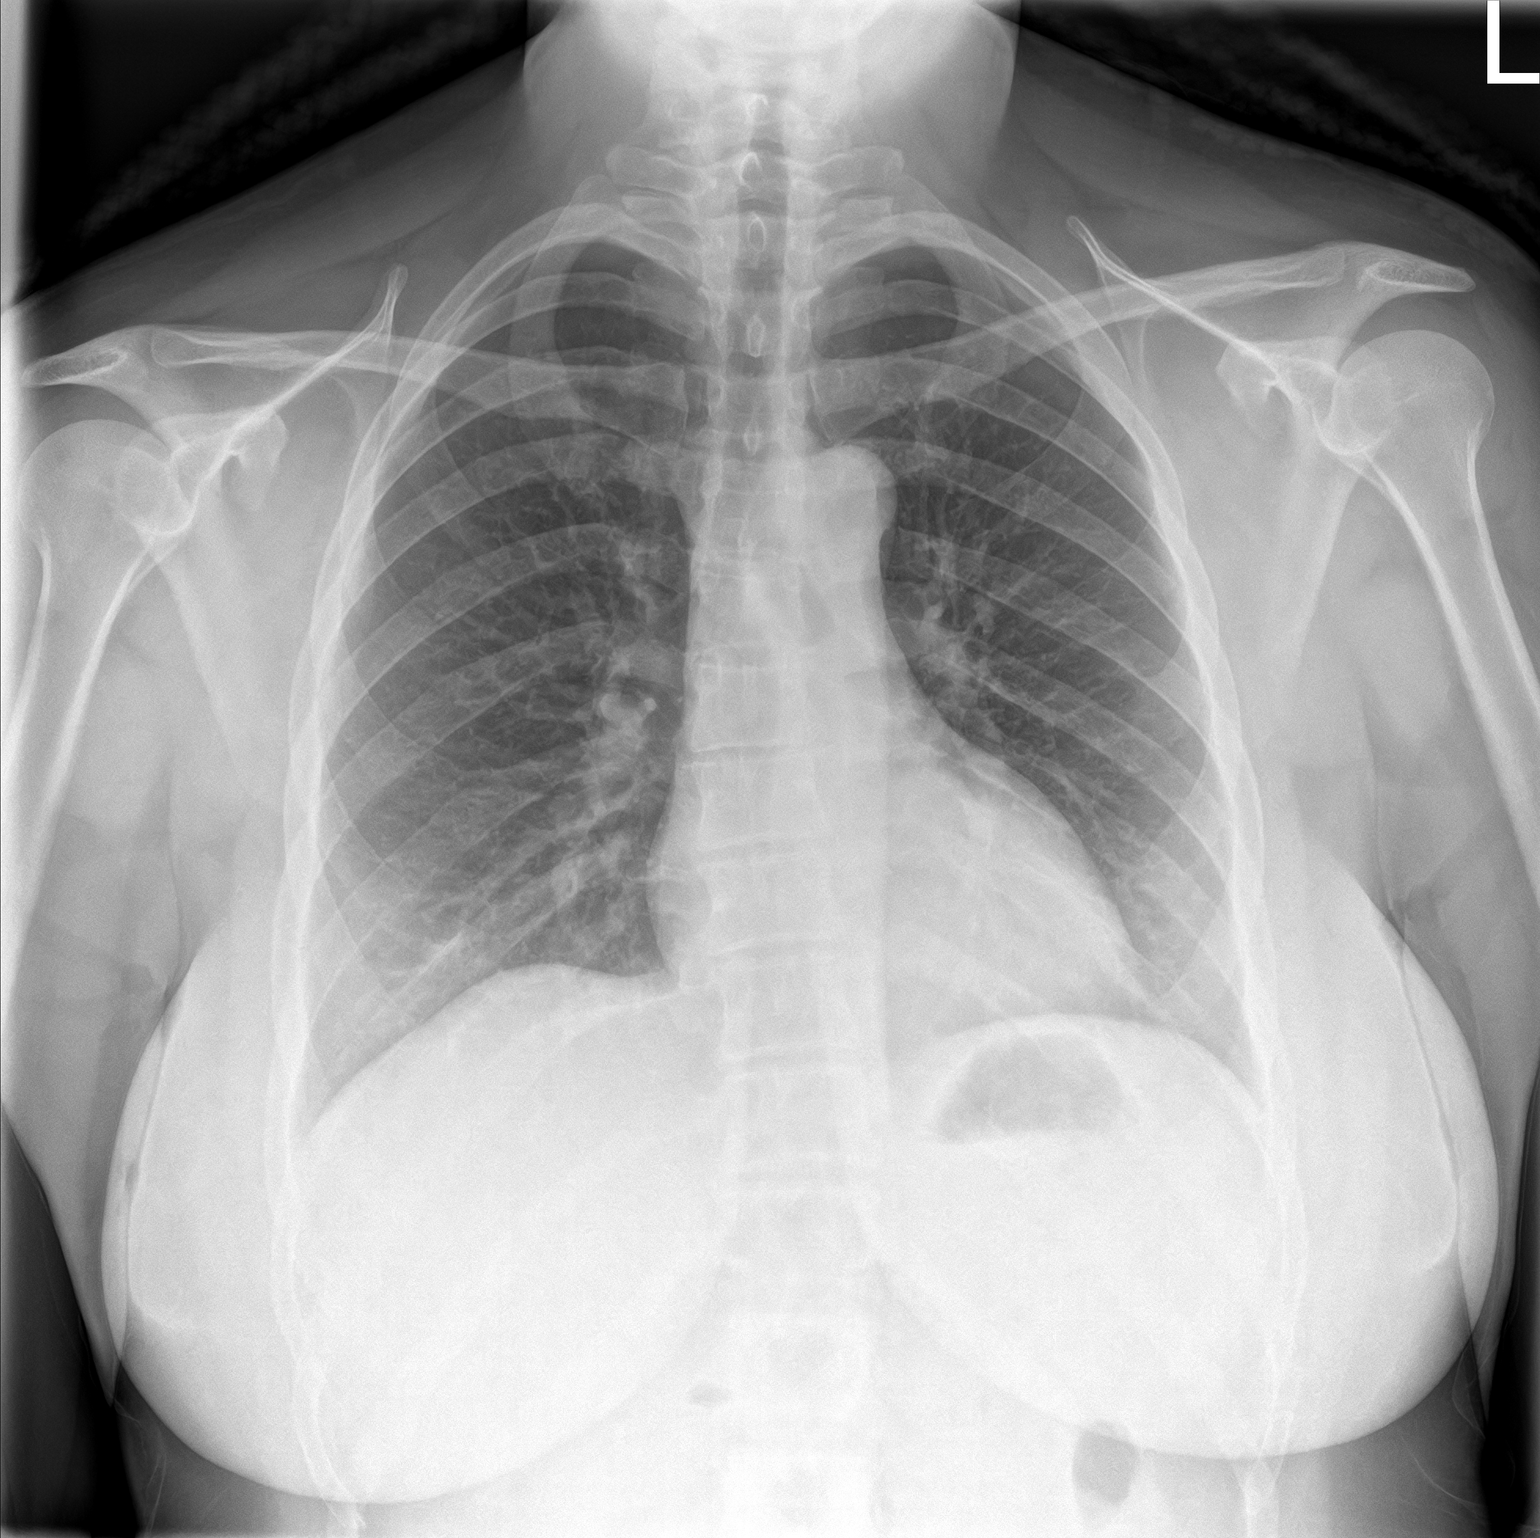
[im 2/2]
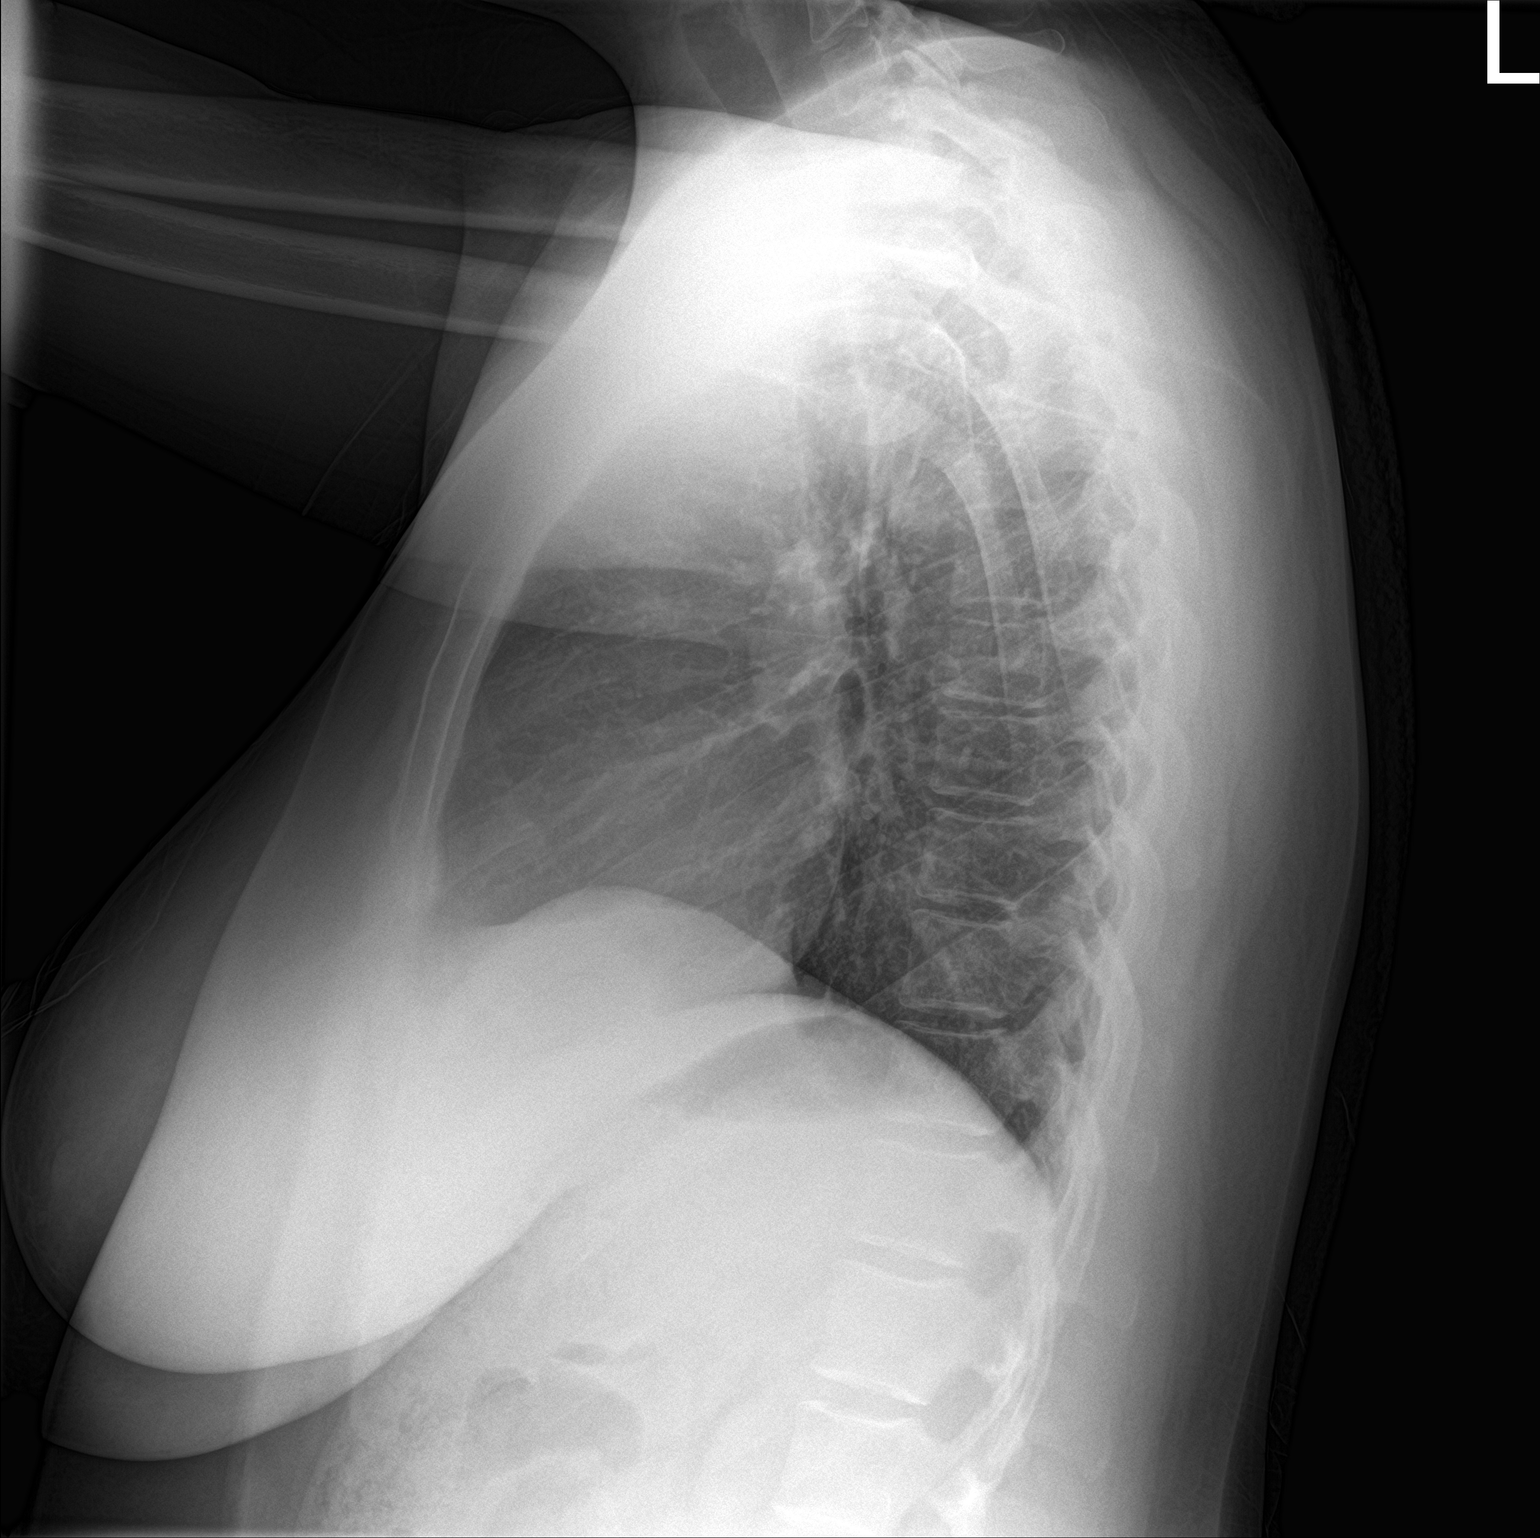

[2 of 2 positions shown; findings below may reference images not displayed]

FINDINGS: The heart size and mediastinal contours are within normal limits.
Both lungs are clear. The visualized skeletal structures are
unremarkable.
IMPRESSION: No active cardiopulmonary disease.

## 2022-08-08 ENCOUNTER — Telehealth: Payer: Self-pay

## 2022-08-08 ENCOUNTER — Ambulatory Visit: Payer: Self-pay

## 2022-08-08 NOTE — Telephone Encounter (Signed)
Patient called triage line stating Dr. Okey Dupre put her on a blood pressure pill and she was currently out and wanted a new prescription. I told her she would need a new appointment to see a new provider as Dr. Okey Dupre is no longer with [place].

## 2022-09-19 ENCOUNTER — Ambulatory Visit: Payer: Self-pay | Admitting: Advanced Practice Midwife

## 2022-09-19 ENCOUNTER — Ambulatory Visit: Payer: Self-pay

## 2022-09-19 ENCOUNTER — Encounter: Payer: Self-pay | Admitting: Advanced Practice Midwife

## 2022-09-19 DIAGNOSIS — Z720 Tobacco use: Secondary | ICD-10-CM

## 2022-09-19 DIAGNOSIS — Z113 Encounter for screening for infections with a predominantly sexual mode of transmission: Secondary | ICD-10-CM

## 2022-09-19 DIAGNOSIS — A599 Trichomoniasis, unspecified: Secondary | ICD-10-CM

## 2022-09-19 DIAGNOSIS — A749 Chlamydial infection, unspecified: Secondary | ICD-10-CM

## 2022-09-19 LAB — WET PREP FOR TRICH, YEAST, CLUE
Trichomonas Exam: NEGATIVE
Yeast Exam: NEGATIVE

## 2022-09-19 LAB — HM HEPATITIS C SCREENING LAB: HM Hepatitis Screen: NEGATIVE

## 2022-09-19 LAB — HM HIV SCREENING LAB: HM HIV Screening: NEGATIVE

## 2022-09-19 NOTE — Progress Notes (Signed)
  Pt is here for STD screening.  Wet mount results reviewed, no treatment required per standing order.  Condoms given.

## 2022-09-19 NOTE — Progress Notes (Signed)
[facility] Department  STI clinic/screening visit [address] [place] [address] [phone]  Subjective:  Lauren Short is a 32 y.o. 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 The patient reports they do have symptoms.  Patient reports that they do desire a pregnancy in the next year.   They reported they are not interested in discussing contraception today.    Patient's last menstrual period was 09/16/2022 (exact date).  Patient has the following medical conditions:   Patient Active Problem List   Diagnosis Date Noted   Morbid obesity (HCC) 210 lbs 09/19/2022   Chlamydia 10/11/21 09/19/2022   Trichomonas infection 10/11/21 09/19/2022   Hypertension 05/10/2021   Menometrorrhagia 08/20/2016    Chief Complaint  Patient presents with   SEXUALLY TRANSMITTED DISEASE    Reports smell and increased discharge     HPI  Patient reports brown d/c, 2 spotting/menses this month, malodor to d/c x 2 wks. Last sex 09/14/22 without condom; with current partner x months; 6 partners in last 3 mo. Last MJ age 2. Last cigar 2022. Last ETOH 09/16/22 (1 Margarita). LMP 09/17/22. Last pap unknown  Does the patient using douching products? No  Last HIV test per patient/review of record was  Lab Results  Component Value Date   HMHIVSCREEN Negative - Validated 05/06/2022   No results found for: "HIV" Patient reports last pap was  Lab Results  Component Value Date   DIAGPAP  02/01/2021    - Negative for intraepithelial lesion or malignancy (NILM)   No results found for: "SPECADGYN"  Screening for MPX risk: Does the patient have an unexplained rash? No Is the patient MSM? No Does the patient endorse multiple sex partners or anonymous sex partners? yes Did the patient have close or sexual contact with a person diagnosed with MPX? No Has the patient traveled outside the [place] where MPX is endemic? No Is there a high clinical suspicion for  MPX-- evidenced by one of the following No  -Unlikely to be chickenpox  -Lymphadenopathy  -Rash that present in same phase of evolution on any given body part See flowsheet for further details and programmatic requirements.   Immunization history:  Immunization History  Administered Date(s) Administered   Hepatitis B 03/22/2002, 09/03/2002   Hpv-Unspecified 08/11/2008   MMR 07/30/1994   Meningococcal Conjugate 08/11/2008     The following portions of the patient's history were reviewed and updated as appropriate: allergies, current medications, past medical history, past social history, past surgical history and problem list.  Objective:  There were no vitals filed for this visit.  Physical Exam Vitals and nursing note reviewed.  Constitutional:      Appearance: Normal appearance. She is obese.  HENT:     Head: Normocephalic and atraumatic.     Mouth/Throat:     Mouth: Mucous membranes are moist.     Pharynx: Oropharynx is clear. No oropharyngeal exudate or posterior oropharyngeal erythema.  Eyes:     Conjunctiva/sclera: Conjunctivae normal.  Pulmonary:     Effort: Pulmonary effort is normal.  Abdominal:     Palpations: Abdomen is soft. There is no mass.     Tenderness: There is no abdominal tenderness. There is no rebound.     Comments: Soft without masses or tenderness  Genitourinary:    General: Normal vulva.     Exam position: Lithotomy position.     Pubic Area: No rash or pubic lice.  Labia:        Right: No rash or lesion.        Left: No rash or lesion.      Vagina: Vaginal discharge (red scant leukorrhea,ph>4.5) present. No erythema, bleeding or lesions.     Cervix: Normal.     Uterus: Normal.      Adnexa: Right adnexa normal and left adnexa normal.     Rectum: Normal.     Comments: pH = >4.5 Lymphadenopathy:     Head:     Right side of head: No preauricular or posterior auricular adenopathy.     Left side of head: No preauricular or posterior  auricular adenopathy.     Cervical: No cervical adenopathy.     Right cervical: No superficial, deep or posterior cervical adenopathy.    Left cervical: No superficial, deep or posterior cervical adenopathy.     Upper Body:     Right upper body: No supraclavicular, axillary or epitrochlear adenopathy.     Left upper body: No supraclavicular, axillary or epitrochlear adenopathy.     Lower Body: No right inguinal adenopathy. No left inguinal adenopathy.  Skin:    General: Skin is warm and dry.     Findings: No rash.  Neurological:     Mental Status: She is alert and oriented to person, place, and time.      Assessment and Plan:  Lauren Short is a 32 y.o. female presenting to the [facility] Department for STI screening  1. Screening examination for venereal disease Treat wet mount per standing orders Immunization nurse consult  - WET PREP FOR TRICH, YEAST, CLUE - Chlamydia/Gonorrhea [place] [facility] - HIV/HCV [place] [facility] - Syphilis Serology, [place] [facility] - Gonococcus culture - Gonococcus culture  2. Morbid obesity (HCC) 210 lbs   3. Chlamydia 10/11/21   4. Trichomonas infection 10/11/21    Patient accepted all screenings including oral, vaginal CT/GC and bloodwork for HIV/RPR, and wet prep. Patient meets criteria for HepB screening? Yes. Ordered? no Patient meets criteria for HepC screening? Yes. Ordered? yes  Treat wet prep per standing order Discussed time line for State Lab results and that patient will be called with positive results and encouraged patient to call if she had not heard in 2 weeks.  Counseled to return or seek care for continued or worsening symptoms Recommended repeat testing in 3 months with positive results. Recommended condom use with all sex  Patient is currently using  nothing  to prevent pregnancy.    No follow-ups on file.  No future appointments.  Alberteen Spindle, CNM

## 2022-09-24 LAB — GONOCOCCUS CULTURE

## 2022-11-08 ENCOUNTER — Other Ambulatory Visit: Payer: Self-pay

## 2022-11-08 ENCOUNTER — Emergency Department: Payer: Self-pay

## 2022-11-08 ENCOUNTER — Emergency Department
Admission: EM | Admit: 2022-11-08 | Discharge: 2022-11-08 | Disposition: A | Discharge status: Home / Self Care | Payer: Self-pay | Attending: Emergency Medicine | Admitting: Emergency Medicine

## 2022-11-08 ENCOUNTER — Encounter: Payer: Self-pay | Admitting: Emergency Medicine

## 2022-11-08 DIAGNOSIS — I10 Essential (primary) hypertension: Secondary | ICD-10-CM | POA: Insufficient documentation

## 2022-11-08 LAB — BASIC METABOLIC PANEL
Anion gap: 9 (ref 5–15)
BUN: 14 mg/dL (ref 6–20)
CO2: 24 mmol/L (ref 22–32)
Calcium: 9 mg/dL (ref 8.9–10.3)
Chloride: 103 mmol/L (ref 98–111)
Creatinine, Ser: 0.82 mg/dL (ref 0.44–1.00)
GFR, Estimated: >60 mL/min (ref >60)
Glucose, Bld: 185 mg/dL — ABNORMAL HIGH (ref 70–99)
Potassium: 3.6 mmol/L (ref 3.5–5.1)
Sodium: 136 mmol/L (ref 135–145)

## 2022-11-08 LAB — CBC
HCT: 41.3 % (ref 36.0–46.0)
Hemoglobin: 14.2 g/dL (ref 12.0–15.0)
MCH: 26.3 pg (ref 26.0–34.0)
MCHC: 34.4 g/dL (ref 30.0–36.0)
MCV: 76.5 fL — ABNORMAL LOW (ref 80.0–100.0)
Platelets: 256 10*3/uL (ref 150–400)
RBC: 5.4 MIL/uL — ABNORMAL HIGH (ref 3.87–5.11)
RDW: 14.9 % (ref 11.5–15.5)
WBC: 7.5 10*3/uL (ref 4.0–10.5)
nRBC: 0 % (ref 0.0–0.2)

## 2022-11-08 LAB — TROPONIN I (HIGH SENSITIVITY): Troponin I (High Sensitivity): 14 ng/L (ref <18)

## 2022-11-08 MED ORDER — AMLODIPINE BESYLATE 10 MG PO TABS: 10.0000 mg | ORAL_TABLET | Freq: Every day | ORAL | 5 refills | Status: DC

## 2022-11-08 MED ORDER — AMLODIPINE BESYLATE 5 MG PO TABS
10.0000 mg | ORAL_TABLET | Freq: Once | ORAL | Status: AC
  Administered 2022-11-08: 10 mg via ORAL
  Filled 2022-11-08: qty 2

## 2022-11-08 MED ORDER — ACETAMINOPHEN 500 MG PO TABS
1000.0000 mg | ORAL_TABLET | Freq: Once | ORAL | Status: AC
  Administered 2022-11-08: 1000 mg via ORAL
  Filled 2022-11-08: qty 2

## 2022-11-08 NOTE — ED Triage Notes (Addendum)
Pt here with headache and some chest tightness. Pt states her headache has been increasingly worse over the past few days. Pt also having left shoulder pain radiating to her chest. Pt denies nausea but endorses some vomiting. Pt thinks her symptoms are related to her high blood pressure, pt takes medication for hypertension.

## 2022-11-08 NOTE — Discharge Instructions (Addendum)
As we discussed if you check your blood pressure at home/pharmacy and it is below 120 on the top number at any point please cut your amlodipine in half and begin taking half a tablet each morning instead of a full tablet.  Please follow-up with your doctor next month as scheduled.  Return to the emergency department for any symptom concerning to yourself.

## 2022-11-08 NOTE — ED Provider Notes (Signed)
[facility] Provider Note    Event Date/Time   First MD Initiated Contact with Patient 11/08/22 [phone]     (approximate)  History   Chief Complaint: Headache and Chest Pain  HPI  Lauren Short is a 32 y.o. female with a past medical history of hypertension who presents to the emergency department for headache chest tightness and high blood pressure.  According to the patient approximately 1 month ago she ran out of her amlodipine medication.  Does not have a current PCP but has an appointment next month to establish care.  Patient states over the last week or 2 she has been experiencing intermittent chest tightness and headaches.  Patient states she has had similar symptoms in the past with high blood pressure.  Patient is in no distress.  Denies any shortness of breath.  Physical Exam   Triage Vital Signs: ED Triage Vitals  Encounter Vitals Group     BP 11/08/22 0754 (!) 171/121     Systolic BP Percentile --      Diastolic BP Percentile --      Pulse Rate 11/08/22 0752 93     Resp 11/08/22 0752 18     Temp 11/08/22 0752 98.8 F (37.1 C)     Temp Source 11/08/22 0752 Oral     SpO2 11/08/22 0752 99 %     Weight 11/08/22 0752 210 lb 1.6 oz (95.3 kg)     Height 11/08/22 0752 5\' 7"  (1.702 m)     Head Circumference --      Peak Flow --      Pain Score 11/08/22 0752 6     Pain Loc --      Pain Education --      Exclude from Growth Chart --     Most recent vital signs: Vitals:   11/08/22 0752 11/08/22 0754  BP:  (!) 171/121  Pulse: 93   Resp: 18   Temp: 98.8 F (37.1 C)   SpO2: 99%     General: Awake, no distress.  CV:  Good peripheral perfusion.  Regular rate and rhythm  Resp:  Normal effort.  Equal breath sounds bilaterally.  Abd:  No distention.  Soft, nontender.  No rebound or guarding.  ED Results / Procedures / Treatments   EKG  EKG viewed and interpreted by myself shows a normal sinus rhythm 86 bpm with a narrow QRS, normal axis,  normal intervals, does have inferolateral T wave changes.  RADIOLOGY  I have reviewed and interpreted the chest x-ray images.  No consolidation seen on my evaluation. Radiology is read the chest x-ray is negative   MEDICATIONS ORDERED IN ED: Medications  amLODipine (NORVASC) tablet 10 mg (has no administration in time range)  acetaminophen (TYLENOL) tablet 1,000 mg (has no administration in time range)     IMPRESSION / MDM / ASSESSMENT AND PLAN / ED COURSE  I reviewed the triage vital signs and the nursing notes.  Patient's presentation is most consistent with acute presentation with potential threat to life or bodily function.  Patient presents emergency department for chest tightness high blood pressure and headache.  Overall the patient appears well, no distress.  Patient is hypertensive 171/121.  Will dose the patient's amlodipine medication as well as Tylenol for her headache.  We will check labs given the patient's EKG changes compared to 2011 when she last had an EKG we will also obtain cardiac enzymes.  Will obtain a chest x-ray and continue  to closely monitor while awaiting results.  Patient agreeable to plan of care.  Patient's chest x-ray is clear lab work is reassuring including a normal CBC reassuring chemistry and a negative troponin.  Patient states she is feeling better.  Will discharge the patient on amlodipine.  Patient has a PCP appointment scheduled for next week.  Discussed my typical return precautions.   FINAL CLINICAL IMPRESSION(S) / ED DIAGNOSES   Chest pain Hypertension Headache    Note:  This document was prepared using Dragon voice recognition software and may include unintentional dictation errors.   Minna Antis, MD 11/08/22 [phone]

## 2022-11-08 NOTE — ED Notes (Signed)
See triage note  Presents with h/a  States h/a started a couple of days ago  No fever  But also is having some chest tightness

## 2022-11-29 NOTE — Progress Notes (Unsigned)
New patient visit   Patient: Lauren Short   DOB: [birth year]   32 y.o. Female  MRN: [id number] Visit Date: 12/04/2022  Today's healthcare provider: Ronnald Ramp, MD   No chief complaint on file.  Subjective    Lauren Short is a 32 y.o. female who presents today as a new patient to establish care.      Pertinent PMHX   Last annual physical: ***   *** Medications: ***   ***  Medications: ***    Health Maintenance ***  Concerns for Today:   ***:   Past Medical History:  Diagnosis Date   Abnormal uterine bleeding    Hypertension    Past Surgical History:  Procedure Laterality Date   NO PAST SURGERIES     Family Status  Relation Name Status   Mother  Alive   Father  Alive   Sister  Alive   Sister  Alive  No partnership data on file   Family History  Problem Relation Age of Onset   Stroke Mother        paralyzed   Hypertension Mother    Hypertension Sister    Hypertension Sister    Social History   Socioeconomic History   Marital status: Single    Spouse name: Not on file   Number of children: Not on file   Years of education: Not on file   Highest education level: Not on file  Occupational History   Not on file  Tobacco Use   Smoking status: Every Day    Types: E-cigarettes, Cigars   Smokeless tobacco: Never  Vaping Use   Vaping status: Every Day   Substances: Nicotine, Flavoring  Substance and Sexual Activity   Alcohol use: Yes    Alcohol/week: 4.0 standard drinks of alcohol    Types: 4 Shots of liquor per week    Comment: last use 09/16/22 2x/mo   Drug use: Not Currently    Types: Marijuana    Comment: last use age 79   Sexual activity: Yes    Partners: Male    Birth control/protection: Pill, None  Other Topics Concern   Not on file  Social History Narrative   Not on file   Social Determinants of Health   Financial Resource Strain: Not on file  Food Insecurity: Not on file  Transportation Needs:  Not on file  Physical Activity: Not on file  Stress: Not on file  Social Connections: Not on file    Outpatient Medications Prior to Visit  Medication Sig   amLODipine (NORVASC) 10 MG tablet Take 1 tablet (10 mg total) by mouth daily.   cephALEXin (KEFLEX) 500 MG capsule Take 1 capsule (500 mg total) by mouth 3 (three) times daily. (Patient not taking: Reported on 01/29/2022)   metroNIDAZOLE (FLAGYL) 500 MG tablet Take 1 tablet (500 mg total) by mouth 2 (two) times daily. (Patient not taking: Reported on 09/19/2022)   norethindrone (ORTHO MICRONOR) 0.35 MG tablet Take 1 tablet (0.35 mg total) by mouth daily. (Patient not taking: Reported on 09/19/2022)   No facility-administered medications prior to visit.   Allergies  Allergen Reactions   Egg-Derived Products Hives   Orange Juice [Orange Oil] Hives    Immunization History  Administered Date(s) Administered   Hepatitis B 03/22/2002, 09/03/2002   Hpv-Unspecified 08/11/2008   MMR 07/30/1994   Meningococcal Conjugate 08/11/2008    Health Maintenance  Topic Date Due   DTaP/Tdap/Td (1 - Tdap) Never done  HPV VACCINES (2 - 3-dose series) 09/08/2008   COVID-19 Vaccine (1 - 2023-24 season) Never done   INFLUENZA VACCINE  11/28/2022   PAP SMEAR-Modifier  02/02/2024   Hepatitis C Screening  Completed   HIV Screening  Completed    Patient Care Team: Pcp, No as PCP - General  Review of Systems  {Insert previous labs (optional):23779}  {See past labs  Heme  Chem  Endocrine  Serology  Results Review (optional):1}   Objective    There were no vitals taken for this visit. {Insert last BP/Wt (optional):23777}  {See vitals history (optional):1}   Depression Screen     No data to display         No results found for any visits on 12/04/22.   Physical Exam ***    Assessment & Plan      Problem List Items Addressed This Visit   None     No follow-ups on file.      Ronnald Ramp, MD  [facility] [phone] (phone) [phone] (fax)  [facility]

## 2022-12-04 ENCOUNTER — Ambulatory Visit (INDEPENDENT_AMBULATORY_CARE_PROVIDER_SITE_OTHER): Payer: Self-pay | Admitting: Family Medicine

## 2022-12-04 ENCOUNTER — Encounter: Payer: Self-pay | Admitting: Family Medicine

## 2022-12-04 VITALS — BP 132/90 | HR 71 | Temp 97.6°F | Resp 12 | Ht 67.0 in | Wt 206.9 lb

## 2022-12-04 DIAGNOSIS — F419 Anxiety disorder, unspecified: Secondary | ICD-10-CM | POA: Insufficient documentation

## 2022-12-04 DIAGNOSIS — Z720 Tobacco use: Secondary | ICD-10-CM

## 2022-12-04 DIAGNOSIS — Z7689 Persons encountering health services in other specified circumstances: Secondary | ICD-10-CM

## 2022-12-04 DIAGNOSIS — N921 Excessive and frequent menstruation with irregular cycle: Secondary | ICD-10-CM

## 2022-12-04 DIAGNOSIS — I10 Essential (primary) hypertension: Secondary | ICD-10-CM

## 2022-12-04 MED ORDER — BUSPIRONE HCL 5 MG PO TABS: 5.0000 mg | ORAL_TABLET | Freq: Two times a day (BID) | ORAL | 2 refills | Status: DC

## 2022-12-04 MED ORDER — SERTRALINE HCL 50 MG PO TABS: 50.0000 mg | ORAL_TABLET | Freq: Every day | ORAL | 3 refills | Status: DC

## 2022-12-04 NOTE — Assessment & Plan Note (Signed)
Blood pressure slightly elevated at 132/90. Patient recently had amlodipine dose increased. Reports shortness of breath and heavy breathing, possibly related to vaping. -Chronic, not yet at goal  -Continue amlodipine 10mg  daily. -Advise cessation of vaping. -Check blood pressure at home regularly, aiming for readings under 130/80. -Order labs to check kidney function, liver function, and electrolytes.

## 2022-12-04 NOTE — Assessment & Plan Note (Signed)
Chronic  Reports recent weight gain and has started walking on the track to help lose weight  Will collect TSH, CMP and CBC today  Encouraged her to continue to walk on breaks during her work day as this will help blood pressure

## 2022-12-04 NOTE — Patient Instructions (Signed)
VISIT SUMMARY:  During your visit, we discussed your concerns about high blood pressure, mood irregularities, and menstrual irregularities. We also talked about your tobacco and alcohol use. We have made some changes to your treatment plan and have recommended some additional tests and referrals to help manage your health.  YOUR PLAN:  -HIGH BLOOD PRESSURE: Your blood pressure is slightly high. We will continue your current medication, amlodipine, and I recommend that you stop vaping, as it can affect your blood pressure. I also suggest that you regularly check your blood pressure at home, aiming for readings under 130/80. We will also do some tests to check your kidney and liver function, and electrolyte levels.  -DEPRESSION AND ANXIETY: You've been feeling numb, overwhelmed, and crying frequently. We will start you on Zoloft for long-term management of these symptoms and Buspar for acute anxiety episodes. I also recommend considering therapy.  -MENSTRUAL IRREGULARITIES: You've been having irregular periods and prolonged bleeding. I will refer you to an OBGYN for further evaluation and management.  -TOBACCO USE: You've been vaping for the past four years. I strongly recommend that you stop vaping due to its impact on your blood pressure and overall health.  -ALCOHOL USE: You've been consuming 2-3 beers and margaritas weekly. I advise moderation in alcohol consumption, avoiding more than 4 drinks in one sitting.  -IRON DEFICIENCY: Your previous lab results suggest possible iron deficiency. We will order labs to check your hemoglobin and iron levels.  -THYROID FUNCTION: We will evaluate your thyroid function due to its potential contribution to your anxiety, depression, and blood pressure issues. We will order labs to check this.  -EYE HEMORRHAGE: You noticed blood around the iris of your left eye. Monitor this condition and report any changes or worsening symptoms.  INSTRUCTIONS:  Please  follow up in 2-3 weeks to monitor your blood pressure, mood, and eye condition. In the meantime, start taking the prescribed medications, stop vaping, moderate your alcohol consumption, and monitor your blood pressure at home. If you notice any changes or worsening symptoms, especially with your eye condition, please contact us immediately.

## 2022-12-04 NOTE — Assessment & Plan Note (Signed)
Reports irregular periods and prolonged bleeding. Desires future pregnancy. -Chronic  -question if she has hx of PCOS given irregularity  -Refer to OBGYN for further evaluation and management.

## 2022-12-04 NOTE — Assessment & Plan Note (Signed)
Reports feeling numb, overwhelmed, and crying frequently. History of anger management issues, now more anxious and depressed. -Start Zoloft 25mg  daily for long-term management. -Start Buspar 5mg  twice daily as needed for acute anxiety episodes. -would ultimately recommend starting with therapy to help with coping skills

## 2022-12-04 NOTE — Assessment & Plan Note (Signed)
Welcomed patient to [facility]  Reviewed patient's medical history, medications, surgical and social history Discussed roles and expectations for primary care physician-patient relationship Recommended patient schedule annual preventative examinations   

## 2022-12-04 NOTE — Assessment & Plan Note (Signed)
Reports vaping for the past four years. -Chronic, daily vape use  -Strongly advise cessation of vaping due to its impact on blood pressure and overall health.

## 2022-12-18 ENCOUNTER — Encounter: Payer: Self-pay | Admitting: Family Medicine

## 2022-12-18 ENCOUNTER — Ambulatory Visit (INDEPENDENT_AMBULATORY_CARE_PROVIDER_SITE_OTHER): Payer: Self-pay | Admitting: Family Medicine

## 2022-12-18 VITALS — BP 132/84 | HR 72 | Temp 97.8°F | Resp 12 | Ht 67.0 in | Wt 206.4 lb

## 2022-12-18 DIAGNOSIS — F419 Anxiety disorder, unspecified: Secondary | ICD-10-CM

## 2022-12-18 DIAGNOSIS — I10 Essential (primary) hypertension: Secondary | ICD-10-CM

## 2022-12-18 DIAGNOSIS — Z720 Tobacco use: Secondary | ICD-10-CM

## 2022-12-18 NOTE — Progress Notes (Signed)
Established patient visit   Patient: Lauren Short   DOB: 22-Oct-1990   32 y.o. Female  MRN: [id number] Visit Date: 12/18/2022  Today's healthcare provider: Ronnald Ramp, MD   Chief Complaint  Patient presents with   Medical Management of Chronic Issues    Patient reports good compliance and tolerance to buspirone and sertraline. She reports symptoms are better. Reports she is not crying anymore. She reports feeling anxious yesterday and took an extra dose of sertraline. Patient is not checking BP at home.   Subjective     HPI     Medical Management of Chronic Issues    Additional comments: Patient reports good compliance and tolerance to buspirone and sertraline. She reports symptoms are better. Reports she is not crying anymore. She reports feeling anxious yesterday and took an extra dose of sertraline. Patient is not checking BP at home.      Last edited by Myles Lipps, CMA on 12/18/2022  3:56 PM.       Discussed the use of AI scribe software for clinical note transcription with the patient, who gave verbal consent to proceed.  History of Present Illness   [name], a patient with a history of hypertension and anxiety, presents for a follow-up visit. The patient reports improved blood pressure readings since the last visit, with the most recent reading being 132/84, down from a previous high of 159/100. The patient is currently on amlodipine 10mg  for blood pressure management and reports no adverse effects such as dizziness or lightheadedness.  The patient also reports an improvement in anxiety symptoms since starting medication. She describes a recent incident at work where she felt highly anxious, but after taking her prescribed medication, she was able to calm down and continue her day. The patient also notes a decrease in depressive symptoms, specifically reporting a cessation of crying spells. She denies any current suicidal ideation,  attributing her resilience to a strong sense of responsibility towards her family.  The patient also mentions an attempt to improve her lifestyle by incorporating healthier dietary choices and engaging in physical activity during work breaks. However, she also admits to a vaping habit, which she acknowledges may be difficult to break.  The patient was unable to complete the requested lab work due to financial constraints, as she is currently transitioning from temporary to full-time employment and is awaiting insurance coverage. She is scheduled for a physical in November, at which point she hopes to have insurance coverage to facilitate lab work.         Medications: Outpatient Medications Prior to Visit  Medication Sig   amLODipine (NORVASC) 10 MG tablet Take 1 tablet (10 mg total) by mouth daily.   busPIRone (BUSPAR) 5 MG tablet Take 1 tablet (5 mg total) by mouth 2 (two) times daily.   sertraline (ZOLOFT) 50 MG tablet Take 1 tablet (50 mg total) by mouth daily.   No facility-administered medications prior to visit.    Review of Systems      Objective    BP 132/84 (BP Location: Left Arm, Patient Position: Sitting, Cuff Size: Large)   Pulse 72   Temp 97.8 F (36.6 C) (Temporal)   Resp 12   Ht 5\' 7"  (1.702 m)   Wt 206 lb 6.4 oz (93.6 kg)   LMP 10/19/2022   SpO2 99%   BMI 32.33 kg/m  BP Readings from Last 3 Encounters:  12/18/22 132/84  12/04/22 (!) 132/90  11/08/22 (!) 159/100  Physical Exam Vitals reviewed.  Constitutional:      General: She is not in acute distress.    Appearance: Normal appearance. She is normal weight. She is not ill-appearing, toxic-appearing or diaphoretic.     Comments: Well groomed, calmly sitting in exam rooom  Eyes:     Conjunctiva/sclera: Conjunctivae normal.  Cardiovascular:     Rate and Rhythm: Normal rate and regular rhythm.     Pulses: Normal pulses.     Heart sounds: Normal heart sounds. No murmur heard.    No friction  rub. No gallop.  Pulmonary:     Effort: Pulmonary effort is normal. No respiratory distress.     Breath sounds: Normal breath sounds. No stridor. No wheezing, rhonchi or rales.  Abdominal:     General: Bowel sounds are normal. There is no distension.     Palpations: Abdomen is soft.     Tenderness: There is no abdominal tenderness.  Musculoskeletal:     Right lower leg: No edema.     Left lower leg: No edema.  Skin:    Findings: No erythema or rash.  Neurological:     Mental Status: She is alert and oriented to person, place, and time.  Psychiatric:        Attention and Perception: Attention and perception normal. She is attentive. She does not perceive auditory or visual hallucinations.        Mood and Affect: Mood and affect normal.        Speech: Speech normal. Speech is not tangential.        Behavior: Behavior normal. Behavior is not agitated, slowed, aggressive or withdrawn. Behavior is cooperative.        Thought Content: Thought content normal. Thought content is not paranoid or delusional. Thought content does not include homicidal or suicidal ideation. Thought content does not include homicidal or suicidal plan.        Judgment: Judgment normal.       No results found for any visits on 12/18/22.  Assessment & Plan     Problem List Items Addressed This Visit     Anxiety - Primary    Patient reports improvement in symptoms with current regimen of Buspar and Sertraline. No suicidal ideation. Patient is using Buspar as needed for acute anxiety episodes. -chronic,improved  -Continue current regimen of Buspar 5mg  BID and Sertraline 50mg  once daily  -Consider increasing dosage if anxiety symptoms worsen or become more frequent      Hypertension    Blood pressure improved but still not at goal (132/84 today, down from 159/100 in July). Patient is currently on Amlodipine 10mg  daily, tolerating well with no reported side effects. -Chronic, not at goal of less than 130/80 but  I am hesitant to add additional medication without ability to review lab work regarding potassium and creatinine, recommended follow up after she is able to obtain insurance coverage from her workplace -Continue Amlodipine 10mg  daily. -Consider adding a second antihypertensive medication if BP remains above goal at next visit.      Vapes nicotine containing substance    Continue to use vape regularly  Recommended cessation to improve overall health and BP management, patient is pre-contemplative now         Depression Patient reports improvement in symptoms with current regimen of Sertraline. No suicidal ideation. -Continue Sertraline at current dosage.      Return in about 3 months (around 03/20/2023) for CPE, HTN.         Tawanna Cooler  Fonda Rochon-Robinson, MD  [facility] [phone] (phone) [phone] (fax)  [facility]

## 2022-12-18 NOTE — Assessment & Plan Note (Signed)
Continue to use vape regularly  Recommended cessation to improve overall health and BP management, patient is pre-contemplative now

## 2022-12-18 NOTE — Assessment & Plan Note (Signed)
Patient reports improvement in symptoms with current regimen of Buspar and Sertraline. No suicidal ideation. Patient is using Buspar as needed for acute anxiety episodes. -chronic,improved  -Continue current regimen of Buspar 5mg  BID and Sertraline 50mg  once daily  -Consider increasing dosage if anxiety symptoms worsen or become more frequent

## 2022-12-18 NOTE — Patient Instructions (Signed)
VISIT SUMMARY:  Dear Lauren Short, thank you for coming in for your follow-up visit. I'm pleased to hear that your blood pressure has improved and your anxiety symptoms have lessened since our last appointment. I understand that you're making efforts to improve your lifestyle, and I encourage you to continue doing so. However, I would advise you to consider quitting your vaping habit, as it can have negative effects on your health.  YOUR PLAN:  -HYPERTENSION: Hypertension, or high blood pressure, is when the force of blood against your artery walls is consistently too high. Your blood pressure has improved but is still not at the desired level. We will continue with your current medication, amlodipine 10mg  daily. If your blood pressure remains above the goal at your next visit, we may consider adding a second medication.  -ANXIETY: Anxiety is a feeling of unease, such as worry or fear, that can be mild or severe. You've reported an improvement in your anxiety symptoms with your current medications, Buspar and Sertraline. We will continue with this regimen and may consider increasing the dosage if your anxiety symptoms worsen or become more frequent.  -DEPRESSION: Depression is a mood disorder that causes a persistent feeling of sadness and loss of interest. You've reported an improvement in your depressive symptoms with your current medication, Sertraline. We will continue with this dosage.  INSTRUCTIONS:  Please schedule a physical in November. If your insurance situation allows, we will also conduct some lab tests at that time. If your anxiety symptoms worsen or become more frequent before then, please schedule an earlier appointment.

## 2022-12-18 NOTE — Assessment & Plan Note (Signed)
Blood pressure improved but still not at goal (132/84 today, down from 159/100 in July). Patient is currently on Amlodipine 10mg  daily, tolerating well with no reported side effects. -Chronic, not at goal of less than 130/80 but I am hesitant to add additional medication without ability to review lab work regarding potassium and creatinine, recommended follow up after she is able to obtain insurance coverage from her workplace -Continue Amlodipine 10mg  daily. -Consider adding a second antihypertensive medication if BP remains above goal at next visit.

## 2023-02-25 ENCOUNTER — Encounter: Payer: Self-pay | Admitting: Family Medicine

## 2023-02-25 ENCOUNTER — Ambulatory Visit: Payer: Self-pay | Admitting: Family Medicine

## 2023-02-25 DIAGNOSIS — N760 Acute vaginitis: Secondary | ICD-10-CM

## 2023-02-25 DIAGNOSIS — B9689 Other specified bacterial agents as the cause of diseases classified elsewhere: Secondary | ICD-10-CM

## 2023-02-25 DIAGNOSIS — Z113 Encounter for screening for infections with a predominantly sexual mode of transmission: Secondary | ICD-10-CM

## 2023-02-25 LAB — WET PREP FOR TRICH, YEAST, CLUE
Trichomonas Exam: NEGATIVE
Yeast Exam: NEGATIVE

## 2023-02-25 LAB — HM HIV SCREENING LAB: HM HIV Screening: NEGATIVE

## 2023-02-25 MED ORDER — METRONIDAZOLE 500 MG PO TABS
500.0000 mg | ORAL_TABLET | Freq: Two times a day (BID) | ORAL | Status: AC
Start: 2023-02-25 — End: 2023-03-04

## 2023-02-25 NOTE — Progress Notes (Addendum)
[facility] Department  STI clinic/screening visit [address] [place] [address] [phone]  Subjective:  Lauren Short is a 32 y.o. female being seen today for an STI screening visit. The patient reports they do have symptoms.  Patient reports that they do not desire a pregnancy in the next year.   They reported they are not interested in discussing contraception today.    Patient's last menstrual period was 01/30/2023 (approximate).  Patient has the following medical conditions:   Patient Active Problem List   Diagnosis Date Noted   Anxiety 12/04/2022   Morbid obesity (HCC) 210 lbs 09/19/2022   Vapes nicotine containing substance 09/19/2022   Hypertension 05/10/2021   Menometrorrhagia 08/20/2016    Chief Complaint  Patient presents with   SEXUALLY TRANSMITTED DISEASE    Vaginal odor    HPI  Patient reports to clinic for STI testing- reports she has an odor  Does the patient using douching products? Practitioner oversight- forgot to ask  Last HIV test per patient/review of record was  Lab Results  Component Value Date   HMHIVSCREEN Negative - Validated 09/19/2022   No results found for: "HIV"   Last HEPC test per patient/review of record was  Lab Results  Component Value Date   HMHEPCSCREEN Negative-Validated 09/19/2022   No components found for: "HEPC"   Last HEPB test per patient/review of record was No components found for: "HMHEPBSCREEN" No components found for: "HEPC"   Patient reports last pap was  Lab Results  Component Value Date   DIAGPAP  02/01/2021    - Negative for intraepithelial lesion or malignancy (NILM)   No results found for: "SPECADGYN"  Screening for MPX risk: Does the patient have an unexplained rash? No Is the patient MSM? No Does the patient endorse multiple sex partners or anonymous sex partners? No Did the patient have close or sexual contact with a person diagnosed with MPX? No Has the patient  traveled outside the [place] where MPX is endemic? No Is there a high clinical suspicion for MPX-- evidenced by one of the following No  -Unlikely to be chickenpox  -Lymphadenopathy  -Rash that present in same phase of evolution on any given body part See flowsheet for further details and programmatic requirements.   Immunization history:  Immunization History  Administered Date(s) Administered   Hepatitis B 03/22/2002, 09/03/2002   Hpv-Unspecified 08/11/2008   MMR 07/30/1994   Meningococcal Conjugate 08/11/2008     The following portions of the patient's history were reviewed and updated as appropriate: allergies, current medications, past medical history, past social history, past surgical history and problem list.  Objective:  There were no vitals filed for this visit.  Physical Exam Vitals and nursing note reviewed. Exam conducted with a chaperone present Charlyne Mom).  Constitutional:      Appearance: Normal appearance.  HENT:     Head: Normocephalic and atraumatic.     Mouth/Throat:     Mouth: Mucous membranes are moist.     Pharynx: Oropharynx is clear. No oropharyngeal exudate or posterior oropharyngeal erythema.  Pulmonary:     Effort: Pulmonary effort is normal.  Abdominal:     General: Abdomen is flat.     Palpations: There is no mass.     Tenderness: There is no abdominal tenderness. There is no rebound.  Genitourinary:    General: Normal vulva.     Exam position: Lithotomy position.     Pubic Area: No rash or pubic lice.  Labia:        Right: No rash or lesion.        Left: No rash or lesion.      Vagina: Vaginal discharge present. No erythema, bleeding or lesions.     Cervix: No cervical motion tenderness, discharge, friability, lesion or erythema.     Uterus: Normal.      Adnexa: Right adnexa normal and left adnexa normal.     Rectum: Normal.     Comments: pH = 5 Lymphadenopathy:     Head:     Right side of head: No preauricular or posterior  auricular adenopathy.     Left side of head: No preauricular or posterior auricular adenopathy.     Cervical: No cervical adenopathy.     Upper Body:     Right upper body: No supraclavicular, axillary or epitrochlear adenopathy.     Left upper body: No supraclavicular, axillary or epitrochlear adenopathy.     Lower Body: No right inguinal adenopathy. No left inguinal adenopathy.  Skin:    General: Skin is warm and dry.     Findings: No rash.  Neurological:     Mental Status: She is alert and oriented to person, place, and time.      Assessment and Plan:  Lauren Short is a 32 y.o. female presenting to the [facility] Department for STI screening  1. Screening for venereal disease  - Chlamydia/Gonorrhea [place] [facility] - HIV [place] [facility] - Syphilis Serology, [place] [facility] - WET PREP FOR TRICH, YEAST, CLUE - Chlamydia/Gonorrhea [place] [facility]  2. Bacterial vaginosis  - metroNIDAZOLE (FLAGYL) 500 MG tablet; Take 1 tablet (500 mg total) by mouth 2 (two) times daily for 7 days.    Patient accepted all screenings including oral, vaginal CT/GC and bloodwork for HIV/RPR, and wet prep. Patient meets criteria for HepB screening? No. Ordered? not applicable Patient meets criteria for HepC screening? No. Ordered? not applicable  Treat wet prep per standing order Discussed time line for [facility] results and that patient will be called with positive results and encouraged patient to call if she had not heard in 2 weeks.  Counseled to return or seek care for continued or worsening symptoms Recommended repeat testing in 3 months with positive results. Recommended condom use with all sex  Patient is currently using  nothing  to prevent pregnancy.    Return if symptoms worsen or fail to improve, for STI screening.  Future Appointments  Date Time Provider Department Center  03/20/2023  8:00 AM Simmons-Robinson, Tawanna Cooler, MD [facility]   Total time spent 20 minutes    Lenice Llamas, [place]

## 2023-02-25 NOTE — Addendum Note (Signed)
Addended by: Gaspar Garbe on: 02/25/2023 04:30 PM   Modules accepted: Orders

## 2023-02-25 NOTE — Addendum Note (Signed)
Addended by: Lenice Llamas on: 02/25/2023 04:51 PM   Modules accepted: Level of Service

## 2023-02-25 NOTE — Progress Notes (Signed)
Pt is here for STD visit.  Wet prep results reviewed with pt, treatment required per standing order.  Condoms declined.   The patient was dispensed metronidazole today. I provided counseling today regarding the medication. We discussed the medication, the side effects and when to call clinic. Patient given the opportunity to ask questions. Questions answered.  Gaspar Garbe, RN

## 2023-03-20 ENCOUNTER — Encounter: Payer: Self-pay | Admitting: Family Medicine

## 2023-03-24 DIAGNOSIS — Z0000 Encounter for general adult medical examination without abnormal findings: Secondary | ICD-10-CM | POA: Insufficient documentation

## 2023-03-24 NOTE — Progress Notes (Deleted)
Complete physical exam   Patient: Lauren Short   DOB: December 02, 1990   32 y.o. Female  MRN: [id number] Visit Date: 03/25/2023  Today's healthcare provider: Ronnald Ramp, MD   No chief complaint on file.  Subjective    Lauren Short is a 32 y.o. female who presents today for a complete physical exam.   She reports consuming a {diet types:17450} diet.   {Exercise:19826}   She generally feels {well/fairly well/poorly:18703}.   She reports sleeping {well/fairly well/poorly:18703}.    She {does/does not:200015} have additional problems to discuss today.   Discussed the use of AI scribe software for clinical note transcription with the patient, who gave verbal consent to proceed.  History of Present Illness             Past Medical History:  Diagnosis Date   Abnormal uterine bleeding    Hypertension    Past Surgical History:  Procedure Laterality Date   NO PAST SURGERIES     Social History   Socioeconomic History   Marital status: Single    Spouse name: Not on file   Number of children: Not on file   Years of education: Not on file   Highest education level: Not on file  Occupational History   Not on file  Tobacco Use   Smoking status: Every Day    Types: E-cigarettes, Cigars   Smokeless tobacco: Never  Vaping Use   Vaping status: Every Day   Substances: Nicotine, Flavoring  Substance and Sexual Activity   Alcohol use: Yes    Alcohol/week: 4.0 standard drinks of alcohol    Types: 4 Shots of liquor per week    Comment: last use 09/16/22 2x/mo   Drug use: Not Currently    Types: Marijuana    Comment: last use age 30   Sexual activity: Yes    Partners: Male    Birth control/protection: Pill, None  Other Topics Concern   Not on file  Social History Narrative   Not on file   Social Determinants of Health   Financial Resource Strain: Not on file  Food Insecurity: Not on file  Transportation Needs: Not on file  Physical  Activity: Not on file  Stress: Not on file  Social Connections: Not on file  Intimate Partner Violence: Not At Risk (05/06/2022)   Humiliation, Afraid, Rape, and Kick questionnaire    Fear of Current or Ex-Partner: No    Emotionally Abused: No    Physically Abused: No    Sexually Abused: No   Family Status  Relation Name Status   Mother  Alive   Father  Deceased   Sister  Alive   Sister  Alive   Emelda Brothers  (Not Specified)  No partnership data on file   Family History  Problem Relation Age of Onset   Stroke Mother        paralyzed   Hypertension Mother    Aneurysm Father 49 - 21   Hypertension Sister    Hypertension Sister    Lupus Paternal Aunt    Allergies  Allergen Reactions   Egg-Derived Products Hives   Orange Juice [Orange Oil] Hives     Medications: Outpatient Medications Prior to Visit  Medication Sig   amLODipine (NORVASC) 10 MG tablet Take 1 tablet (10 mg total) by mouth daily.   busPIRone (BUSPAR) 5 MG tablet Take 1 tablet (5 mg total) by mouth 2 (two) times daily.   sertraline (ZOLOFT) 50 MG tablet  Take 1 tablet (50 mg total) by mouth daily.   No facility-administered medications prior to visit.    Review of Systems  {Insert previous labs (optional):23779} {See past labs  Heme  Chem  Endocrine  Serology  Results Review (optional):1}  Objective    LMP 01/30/2023 (Approximate)  {Insert last BP/Wt (optional):23777}{See vitals history (optional):1}    Physical Exam  ***  Last depression screening scores    12/18/2022    3:52 PM 12/04/2022    3:30 PM  PHQ 2/9 Scores  PHQ - 2 Score 4 3  PHQ- 9 Score 12 10    Last fall risk screening    12/18/2022    3:52 PM  Fall Risk   Falls in the past year? 0  Number falls in past yr: 0  Injury with Fall? 0  Risk for fall due to : No Fall Risks  Follow up Falls evaluation completed    Last Audit-C alcohol use screening     No data to display         A score of 3 or more in women, and 4 or  more in men indicates increased risk for alcohol abuse, EXCEPT if all of the points are from question 1   No results found for any visits on 03/25/23.  Assessment & Plan    Routine Health Maintenance and Physical Exam  Immunization History  Administered Date(s) Administered   Hepatitis B 03/22/2002, 09/03/2002   Hpv-Unspecified 08/11/2008   MMR 07/30/1994   Meningococcal Conjugate 08/11/2008    Health Maintenance  Topic Date Due   DTaP/Tdap/Td (1 - Tdap) Never done   HPV VACCINES (2 - 3-dose series) 09/08/2008   COVID-19 Vaccine (1 - 2023-24 season) Never done   INFLUENZA VACCINE  07/28/2023 (Originally 11/28/2022)   Cervical Cancer Screening (HPV/Pap Cotest)  02/01/2026   Hepatitis C Screening  Completed   HIV Screening  Completed    Problem List Items Addressed This Visit   None   Assessment and Plan                No follow-ups on file.       Ronnald Ramp, MD  [facility] [phone] (phone) [phone] (fax)  [facility]

## 2023-03-25 ENCOUNTER — Encounter: Payer: Self-pay | Admitting: Family Medicine

## 2023-03-25 DIAGNOSIS — Z131 Encounter for screening for diabetes mellitus: Secondary | ICD-10-CM

## 2023-03-25 DIAGNOSIS — Z720 Tobacco use: Secondary | ICD-10-CM

## 2023-03-25 DIAGNOSIS — I10 Essential (primary) hypertension: Secondary | ICD-10-CM

## 2023-03-25 DIAGNOSIS — Z0000 Encounter for general adult medical examination without abnormal findings: Secondary | ICD-10-CM

## 2023-03-25 DIAGNOSIS — Z13220 Encounter for screening for lipoid disorders: Secondary | ICD-10-CM

## 2023-03-25 DIAGNOSIS — Z130 Encounter for screening for diseases of the blood and blood-forming organs and certain disorders involving the immune mechanism: Secondary | ICD-10-CM

## 2023-07-15 ENCOUNTER — Other Ambulatory Visit: Payer: Self-pay

## 2023-07-15 ENCOUNTER — Encounter: Payer: Self-pay | Admitting: *Deleted

## 2023-07-15 ENCOUNTER — Emergency Department
Admission: EM | Admit: 2023-07-15 | Discharge: 2023-07-15 | Disposition: A | Discharge status: Home / Self Care | Payer: Self-pay | Attending: Emergency Medicine | Admitting: Emergency Medicine

## 2023-07-15 DIAGNOSIS — I10 Essential (primary) hypertension: Secondary | ICD-10-CM | POA: Insufficient documentation

## 2023-07-15 DIAGNOSIS — K529 Noninfective gastroenteritis and colitis, unspecified: Secondary | ICD-10-CM | POA: Insufficient documentation

## 2023-07-15 LAB — URINALYSIS, ROUTINE W REFLEX MICROSCOPIC
Bacteria, UA: NONE SEEN
Bilirubin Urine: NEGATIVE
Glucose, UA: NEGATIVE mg/dL
Ketones, ur: NEGATIVE mg/dL
Leukocytes,Ua: NEGATIVE
Nitrite: NEGATIVE
Protein, ur: NEGATIVE mg/dL
Specific Gravity, Urine: 1.029 (ref 1.005–1.030)
pH: 5 (ref 5.0–8.0)

## 2023-07-15 LAB — COMPREHENSIVE METABOLIC PANEL
ALT: 34 U/L (ref 0–44)
AST: 30 U/L (ref 15–41)
Albumin: 4.2 g/dL (ref 3.5–5.0)
Alkaline Phosphatase: 56 U/L (ref 38–126)
Anion gap: 10 (ref 5–15)
BUN: 18 mg/dL (ref 6–20)
CO2: 27 mmol/L (ref 22–32)
Calcium: 9.5 mg/dL (ref 8.9–10.3)
Chloride: 101 mmol/L (ref 98–111)
Creatinine, Ser: 1.05 mg/dL — ABNORMAL HIGH (ref 0.44–1.00)
GFR, Estimated: >60 mL/min (ref >60)
Glucose, Bld: 115 mg/dL — ABNORMAL HIGH (ref 70–99)
Potassium: 3.7 mmol/L (ref 3.5–5.1)
Sodium: 138 mmol/L (ref 135–145)
Total Bilirubin: 0.9 mg/dL (ref 0.0–1.2)
Total Protein: 7.6 g/dL (ref 6.5–8.1)

## 2023-07-15 LAB — CBC
HCT: 39.6 % (ref 36.0–46.0)
Hemoglobin: 14 g/dL (ref 12.0–15.0)
MCH: 27.3 pg (ref 26.0–34.0)
MCHC: 35.4 g/dL (ref 30.0–36.0)
MCV: 77.3 fL — ABNORMAL LOW (ref 80.0–100.0)
Platelets: 304 10*3/uL (ref 150–400)
RBC: 5.12 MIL/uL — ABNORMAL HIGH (ref 3.87–5.11)
RDW: 14.6 % (ref 11.5–15.5)
WBC: 8.1 10*3/uL (ref 4.0–10.5)
nRBC: 0 % (ref 0.0–0.2)

## 2023-07-15 LAB — LIPASE, BLOOD: Lipase: 27 U/L (ref 11–51)

## 2023-07-15 LAB — POC URINE PREG, ED: Preg Test, Ur: NEGATIVE

## 2023-07-15 MED ORDER — AMLODIPINE BESYLATE 5 MG PO TABS
10.0000 mg | ORAL_TABLET | Freq: Once | ORAL | Status: AC
  Administered 2023-07-15: 10 mg via ORAL

## 2023-07-15 MED ORDER — ONDANSETRON 4 MG PO TBDP: 4.0000 mg | ORAL_TABLET | Freq: Three times a day (TID) | ORAL | 0 refills | Status: DC | PRN

## 2023-07-15 MED ORDER — AMLODIPINE BESYLATE 5 MG PO TABS
ORAL_TABLET | ORAL | Status: AC
  Filled 2023-07-15: qty 2

## 2023-07-15 MED ORDER — AMLODIPINE BESYLATE 10 MG PO TABS: 10.0000 mg | ORAL_TABLET | Freq: Every day | ORAL | 1 refills | Status: DC

## 2023-07-15 MED ORDER — ONDANSETRON 4 MG PO TBDP
ORAL_TABLET | ORAL | Status: AC
  Filled 2023-07-15: qty 1

## 2023-07-15 MED ORDER — ONDANSETRON 4 MG PO TBDP
4.0000 mg | ORAL_TABLET | Freq: Once | ORAL | Status: AC
  Administered 2023-07-15: 4 mg via ORAL

## 2023-07-15 MED ORDER — LOPERAMIDE HCL 2 MG PO TABS: 2.0000 mg | ORAL_TABLET | Freq: Four times a day (QID) | ORAL | 0 refills | Status: DC | PRN

## 2023-07-15 NOTE — ED Triage Notes (Signed)
 Pt ambulatory to triage.  Pt reports abd pain since yesterday.  Diarrhea began this morning and vomiting began this afternoon.  Denies urinary sx.  Pt alert  speech clear.

## 2023-07-15 NOTE — ED Provider Notes (Signed)
 [facility] Provider Note    Event Date/Time   First MD Initiated Contact with Patient 07/15/23 2113     (approximate)   History   Chief Complaint Abdominal Pain   HPI  Lauren Short is a 33 y.o. female with past medical history of hypertension who presents to the ED complaining of abdominal pain.  Patient reports that she woke up this morning and had an episode of watery diarrhea, then developed diffuse crampy abdominal pain.  Pain is improved over the course of the day but she continues to have significant diarrhea as well as nausea and a couple episodes of vomiting.  She denies any fevers, cough, chest pain, or shortness of breath.  She has not had any dysuria or flank pain.  She has had a hard time keeping down solids, but does state that she has been able to drink liquids without difficulty.  Patient does state that she has been without her blood pressure medications for at least the past week.     Physical Exam   Triage Vital Signs: ED Triage Vitals [07/15/23 1934]  Encounter Vitals Group     BP (!) 181/123     Systolic BP Percentile      Diastolic BP Percentile      Pulse Rate 88     Resp 18     Temp 98.7 F (37.1 C)     Temp Source Oral     SpO2 98 %     Weight 220 lb (99.8 kg)     Height 5\' 6"  (1.676 m)     Head Circumference      Peak Flow      Pain Score 7     Pain Loc      Pain Education      Exclude from Growth Chart     Most recent vital signs: Vitals:   07/15/23 2130 07/15/23 2200  BP: (!) 182/123 (!) 152/109  Pulse: 67 69  Resp:    Temp:    SpO2: 99% 100%    Constitutional: Alert and oriented. Eyes: Conjunctivae are normal. Head: Atraumatic. Nose: No congestion/rhinnorhea. Mouth/Throat: Mucous membranes are moist.  Cardiovascular: Normal rate, regular rhythm. Grossly normal heart sounds.  2+ radial pulses bilaterally. Respiratory: Normal respiratory effort.  No retractions. Lungs CTAB. Gastrointestinal: Soft  and nontender. No distention. Musculoskeletal: No lower extremity tenderness nor edema.  Neurologic:  Normal speech and language. No gross focal neurologic deficits are appreciated.    ED Results / Procedures / Treatments   Labs (all labs ordered are listed, but only abnormal results are displayed) Labs Reviewed  COMPREHENSIVE METABOLIC PANEL - Abnormal; Notable for the following components:      Result Value   Glucose, Bld 115 (*)    Creatinine, Ser 1.05 (*)    All other components within normal limits  CBC - Abnormal; Notable for the following components:   RBC 5.12 (*)    MCV 77.3 (*)    All other components within normal limits  URINALYSIS, ROUTINE W REFLEX MICROSCOPIC - Abnormal; Notable for the following components:   Color, Urine YELLOW (*)    APPearance HAZY (*)    Hgb urine dipstick MODERATE (*)    All other components within normal limits  LIPASE, BLOOD  POC URINE PREG, ED    PROCEDURES:  Critical Care performed: No  Procedures   MEDICATIONS ORDERED IN ED: Medications  amLODipine (NORVASC) 5 MG tablet (  Not Given 07/15/23 2204)  ondansetron (ZOFRAN-ODT) 4 MG disintegrating tablet (  Not Given 07/15/23 2205)  ondansetron (ZOFRAN-ODT) disintegrating tablet 4 mg (4 mg Oral Given 07/15/23 2146)  amLODipine (NORVASC) tablet 10 mg (10 mg Oral Given 07/15/23 2145)     IMPRESSION / MDM / ASSESSMENT AND PLAN / ED COURSE  I reviewed the triage vital signs and the nursing notes.                              33 y.o. female with past medical history of hypertension who presents to the ED complaining of nausea, vomiting, diarrhea, and crampy abdominal pain since waking up this morning.  Patient's presentation is most consistent with acute presentation with potential threat to life or bodily function.  Differential diagnosis includes, but is not limited to, gastroenteritis, dehydration, electrolyte abnormality, AKI, appendicitis, kidney stone, diverticulitis,  UTI.  Patient nontoxic-appearing and in no acute distress, vital signs are remarkable for hypertension but otherwise reassuring.  She has a benign abdominal exam and symptoms seem most consistent with gastroenteritis.  Pregnancy testing is negative and urinalysis shows no signs of infection.  Labs are reassuring with no significant anemia, leukocytosis, electrolyte abnormality, or AKI.  Do not feel CT imaging is indicated at this time, will give dose of Zofran and p.o. challenge.  Patient has missed multiple doses of her amlodipine, will give dose here in the ED but no evidence of hypertensive emergency at this time.  Patient feeling better following dose of Zofran, tolerating oral intake without difficulty at this time with improvement in blood pressure following amlodipine.  She is appropriate for discharge home with outpatient follow-up, was counseled to return to the ED for new or worsening symptoms.  Patient agrees with plan.      FINAL CLINICAL IMPRESSION(S) / ED DIAGNOSES   Final diagnoses:  Gastroenteritis     Rx / DC Orders   ED Discharge Orders          Ordered    ondansetron (ZOFRAN-ODT) 4 MG disintegrating tablet  Every 8 hours PRN        07/15/23 2231             Note:  This document was prepared using Dragon voice recognition software and may include unintentional dictation errors.   Chesley Noon, MD 07/15/23 2233

## 2023-07-22 ENCOUNTER — Other Ambulatory Visit: Payer: Self-pay | Admitting: Family Medicine

## 2023-07-22 DIAGNOSIS — F419 Anxiety disorder, unspecified: Secondary | ICD-10-CM

## 2023-07-22 NOTE — Telephone Encounter (Signed)
 Copied from CRM [id number]. Topic: Clinical - Medication Refill >> Jul 22, 2023  9:43 AM Priscille Loveless wrote: Most Recent Primary Care Visit:  Provider: Ronnald Ramp  Department: [id number]  Visit Type: OFFICE VISIT  Date: 12/18/2022  Medication:   sertraline (ZOLOFT) 50 MG tablet       busPIRone (BUSPAR) 5 MG tablet   Has the patient contacted their pharmacy? No  Is this the correct pharmacy for this prescription? Yes  This is the patient's preferred pharmacy:  [facility] [address] [place] - [address] [place] [address] Phone: [phone] Fax: [phone]   Has the prescription been filled recently? Yes  Is the patient out of the medication? Yes  Has the patient been seen for an appointment in the last year OR does the patient have an upcoming appointment? Yes  Can we respond through MyChart? Yes  Agent: Please be advised that Rx refills may take up to 3 business days. We ask that you follow-up with your pharmacy.

## 2023-07-23 MED ORDER — SERTRALINE HCL 50 MG PO TABS
50.0000 mg | ORAL_TABLET | Freq: Every day | ORAL | 0 refills | Status: DC
Start: 2023-07-23 — End: 2024-02-05

## 2023-07-23 MED ORDER — BUSPIRONE HCL 5 MG PO TABS
5.0000 mg | ORAL_TABLET | Freq: Two times a day (BID) | ORAL | 0 refills | Status: DC
Start: 2023-07-23 — End: 2024-02-05

## 2023-07-23 NOTE — Telephone Encounter (Signed)
 Requested medication (s) are due for refill today: yes  Requested medication (s) are on the active medication list: yes  Last refill:  12/04/22  Future visit scheduled: no  Notes to clinic:  pt due for OV, has canceled and no show since LOV.      Requested Prescriptions  Pending Prescriptions Disp Refills   busPIRone (BUSPAR) 5 MG tablet 60 tablet 2    Sig: Take 1 tablet (5 mg total) by mouth 2 (two) times daily.     Psychiatry: Anxiolytics/Hypnotics - Non-controlled Passed - 07/23/2023  2:27 PM      Passed - Valid encounter within last 12 months    Recent Outpatient Visits           7 months ago Anxiety   [facility] [place], [address], MD   7 months ago Anxiety   [place] [facility] Perkins, [place], MD               sertraline (ZOLOFT) 50 MG tablet 30 tablet 3    Sig: Take 1 tablet (50 mg total) by mouth daily.     Psychiatry:  Antidepressants - SSRI - sertraline Failed - 07/23/2023  2:27 PM      Failed - Valid encounter within last 6 months    Recent Outpatient Visits           7 months ago Anxiety   [facility] Kimball, [place], MD   7 months ago Anxiety   [facility] Kotlik, [place], MD              Passed - AST in normal range and within 360 days    AST  Date Value Ref Range Status  07/15/2023 30 15 - 41 U/L Final         Passed - ALT in normal range and within 360 days    ALT  Date Value Ref Range Status  07/15/2023 34 0 - 44 U/L Final         Passed - Completed PHQ-2 or PHQ-9 in the last 360 days

## 2023-08-01 ENCOUNTER — Ambulatory Visit: Payer: Self-pay | Admitting: Family Medicine

## 2023-08-01 ENCOUNTER — Encounter: Payer: Self-pay | Admitting: Nurse Practitioner

## 2023-08-01 DIAGNOSIS — Z113 Encounter for screening for infections with a predominantly sexual mode of transmission: Secondary | ICD-10-CM

## 2023-08-01 LAB — WET PREP FOR TRICH, YEAST, CLUE
Trichomonas Exam: NEGATIVE
Yeast Exam: NEGATIVE

## 2023-08-01 LAB — HM HIV SCREENING LAB: HM HIV Screening: NEGATIVE

## 2023-08-01 NOTE — Progress Notes (Signed)
 Pt is here for STD screening , Wet prep results reviewed with pt, no treatment required. Condoms declined, patient given the opportunity to ask questions for any clarifications. Sonda Primes, RN.

## 2023-08-01 NOTE — Progress Notes (Signed)
 [facility] STI clinic [address], [address] [place] [address] Main phone: [phone]  STI screening visit  Subjective:  KALLEY NICHOLL is a 33 y.o. female being seen today for an STI screening visit. The patient reports they do not have symptoms.  Patient reports that they do not desire a pregnancy in the next year.   They reported they are not interested in discussing contraception today.    Patient's last menstrual period was 07/12/2023 (approximate).  Patient has the following medical conditions:  Patient Active Problem List   Diagnosis Date Noted   Annual physical exam 03/24/2023   Anxiety 12/04/2022   Morbid obesity (HCC) 210 lbs 09/19/2022   Vapes nicotine containing substance 09/19/2022   Hypertension 05/10/2021   Menometrorrhagia 08/20/2016    Chief Complaint  Patient presents with   SEXUALLY TRANSMITTED DISEASE    HPI HPI Patient reports to clinic for STI testing- denies symptoms  Does the patient using douching products? No  Last HIV test per patient/review of record was  Lab Results  Component Value Date   HMHIVSCREEN Negative - Validated 02/25/2023   No results found for: "HIV"   Last HEPC test per patient/review of record was  Lab Results  Component Value Date   HMHEPCSCREEN Negative-Validated 09/19/2022   No components found for: "HEPC"   Last HEPB test per patient/review of record was No components found for: "HMHEPBSCREEN"   Patient reports last pap was: 2022     Component Value Date/Time   DIAGPAP  02/01/2021 1625    - Negative for intraepithelial lesion or malignancy (NILM)   HPVHIGH Negative 02/01/2021 1625   ADEQPAP  02/01/2021 1625    Satisfactory for evaluation; transformation zone component PRESENT.   No results found for: "SPECADGYN" Result Date Procedure Results Follow-ups  02/01/2021 Cytology - PAP High risk HPV: Negative Comment: Normal Reference Range Neisseria Gonorrhea -  Negative Neisseria Gonorrhea: Negative Chlamydia: Negative Trichomonas: Negative Adequacy: Satisfactory for evaluation; transformation zone component PRESENT. Diagnosis: - Negative for intraepithelial lesion or malignancy (NILM) Comment: Normal Reference Range Trichomonas - Negative Comment: Normal Reference Range HPV - Negative Comment: Normal Reference Ranger Chlamydia - Negative     Screening for MPX risk: Does the patient have an unexplained rash? No Is the patient MSM? No Does the patient endorse multiple sex partners or anonymous sex partners? No Did the patient have close or sexual contact with a person diagnosed with MPX? No Has the patient traveled outside the [place] where MPX is endemic? No Is there a high clinical suspicion for MPX-- evidenced by one of the following No  -Unlikely to be chickenpox  -Lymphadenopathy  -Rash that present in same phase of evolution on any given body part See flowsheet for further details and programmatic requirements.   Immunization history:  Immunization History  Administered Date(s) Administered   Hepatitis B 03/22/2002, 09/03/2002   Hpv-Unspecified 08/11/2008   MMR 07/30/1994   Meningococcal Conjugate 08/11/2008     The following portions of the patient's history were reviewed and updated as appropriate: allergies, current medications, past medical history, past social history, past surgical history and problem list.  Objective:  There were no vitals filed for this visit.  Physical Exam Vitals and nursing note reviewed.  Constitutional:      Appearance: Normal appearance.  HENT:     Head: Normocephalic.     Mouth/Throat:     Mouth: Mucous membranes are moist.  Cardiovascular:     Rate and Rhythm:  Normal rate.  Pulmonary:     Effort: Pulmonary effort is normal.  Abdominal:     Palpations: Abdomen is soft.  Genitourinary:    Comments: Declined genital exam- no symptoms, self swabbed Musculoskeletal:        General: Normal  range of motion.  Lymphadenopathy:     Head:     Right side of head: No submandibular, preauricular or posterior auricular adenopathy.     Left side of head: No submandibular, preauricular or posterior auricular adenopathy.     Cervical: No cervical adenopathy.     Upper Body:     Right upper body: No supraclavicular or axillary adenopathy.     Left upper body: No supraclavicular or axillary adenopathy.  Skin:    General: Skin is warm and dry.  Neurological:     Mental Status: She is alert and oriented to person, place, and time.  Psychiatric:        Mood and Affect: Mood normal.     Assessment and Plan:  MELAYA HOSELTON is a 33 y.o. female presenting to the [facility] Department for STI screening  1. Screening for venereal disease (Primary)  - Chlamydia/Gonorrhea [place] [facility] - HIV [place] [facility] - Syphilis Serology, [place] [facility] - WET PREP FOR TRICH, YEAST, CLUE   Patient accepted all screenings including vaginal CT/GC and bloodwork for HIV/RPR, and wet prep. Patient meets criteria for HepB screening? No. Ordered? not applicable Patient meets criteria for HepC screening? No. Ordered? not applicable  Treat wet prep per standing order Discussed time line for [facility] results and that patient will be called with positive results and encouraged patient to call if she had not heard in 2 weeks.  Counseled to return or seek care for continued or worsening symptoms Recommended repeat testing in 3 months with positive results. Recommended condom use with all sex for STI prevention.   Patient is currently using  nothing  to prevent pregnancy.    Return if symptoms worsen or fail to improve, for STI screening.  No future appointments.  Lenice Llamas, [place]

## 2023-08-15 ENCOUNTER — Other Ambulatory Visit: Payer: Self-pay

## 2023-08-15 ENCOUNTER — Emergency Department
Admission: EM | Admit: 2023-08-15 | Discharge: 2023-08-15 | Disposition: A | Discharge status: Home / Self Care | Payer: Self-pay | Attending: Emergency Medicine | Admitting: Emergency Medicine

## 2023-08-15 DIAGNOSIS — N61 Mastitis without abscess: Secondary | ICD-10-CM | POA: Insufficient documentation

## 2023-08-15 MED ORDER — CEPHALEXIN 500 MG PO CAPS
500.0000 mg | ORAL_CAPSULE | Freq: Once | ORAL | Status: AC
  Administered 2023-08-15: 500 mg via ORAL
  Filled 2023-08-15: qty 1

## 2023-08-15 MED ORDER — CEFADROXIL 500 MG PO CAPS: 500.0000 mg | ORAL_CAPSULE | Freq: Two times a day (BID) | ORAL | 0 refills | Status: DC

## 2023-08-15 MED ORDER — CEFADROXIL 500 MG PO CAPS: 500.0000 mg | ORAL_CAPSULE | Freq: Two times a day (BID) | ORAL | 0 refills | Status: AC

## 2023-08-15 MED ORDER — SULFAMETHOXAZOLE-TRIMETHOPRIM 800-160 MG PO TABS: 1.0000 | ORAL_TABLET | Freq: Two times a day (BID) | ORAL | 0 refills | Status: AC

## 2023-08-15 MED ORDER — SULFAMETHOXAZOLE-TRIMETHOPRIM 800-160 MG PO TABS
1.0000 | ORAL_TABLET | Freq: Once | ORAL | Status: AC
  Administered 2023-08-15: 1 via ORAL
  Filled 2023-08-15: qty 1

## 2023-08-15 NOTE — ED Triage Notes (Addendum)
 Pt reports she got her breast pierced last month and 1 week ago she began to have redness and drainage from around piercing. Pt has hx HTN did not take meds today

## 2023-08-15 NOTE — ED Provider Notes (Signed)
 [facility] Provider Note    Event Date/Time   First MD Initiated Contact with Patient 08/15/23 0501     (approximate)   History   Breast Problem   HPI Lauren Short is a 33 y.o. female who presents for possible infection of her right breast.  She got a piercing through the nipple about a month ago and then over the last week she has had some redness and then some pus draining from the piercing itself.  She has been applying some warm salt water to the area at the recommendation of one of her friends and she thinks that some of the infection drained out because it is actually better than it was.  However she still has some redness around the outside of the areola and a little bit of tenderness although it is much improved from before.  No fever.  No other discharge from the nipple.     Physical Exam   Triage Vital Signs: ED Triage Vitals  Encounter Vitals Group     BP 08/15/23 0134 (!) 187/135     Systolic BP Percentile --      Diastolic BP Percentile --      Pulse Rate 08/15/23 0134 79     Resp 08/15/23 0134 18     Temp 08/15/23 0134 98.4 F (36.9 C)     Temp Source 08/15/23 0134 Oral     SpO2 08/15/23 0134 98 %     Weight 08/15/23 0133 99.8 kg (220 lb)     Height 08/15/23 0133 1.702 m (5' 7)     Head Circumference --      Peak Flow --      Pain Score 08/15/23 0133 6     Pain Loc --      Pain Education --      Exclude from Growth Chart --     Most recent vital signs: Vitals:   08/15/23 0445 08/15/23 0455  BP: (!) 163/117   Pulse: 81   Resp: 18   Temp:    SpO2: 99% 99%    General: Awake, no distress.  CV:  Good peripheral perfusion.  Resp:  Normal effort. Speaking easily and comfortably, no accessory muscle usage nor intercostal retractions.   Abd:  No distention.  Other:  The patient has some mild erythema consistent with superficial cellulitis scattered around the areola both superior and inferior lateral on the right breast.   There is no palpable induration and there is no drainage from the piercing that is still present through the right nipple.  I did not palpate any induration or fluctuance anywhere within the areola or near the nipple and the patient reports that it is nontender.   ED Results / Procedures / Treatments   Labs (all labs ordered are listed, but only abnormal results are displayed) Labs Reviewed - No data to display    PROCEDURES:  Critical Care performed: No  Procedures    IMPRESSION / MDM / ASSESSMENT AND PLAN / ED COURSE  I reviewed the triage vital signs and the nursing notes.                              Differential diagnosis includes, but is not limited to, abscess, cellulitis, phlegmon.  Patient's presentation is most consistent with acute, uncomplicated illness.  Interventions/Medications given:  Medications  cephALEXin  (KEFLEX ) capsule 500 mg (has no administration in time range)  sulfamethoxazole -trimethoprim  (BACTRIM  DS) 800-160 MG per tablet 1 tablet (has no administration in time range)    (Note:  hospital course my include additional interventions and/or labs/studies not listed above.)   Exam is consistent with cellulitis.  I cannot palpate any abscess or induration of which I could obtain an ultrasound.  It sounds as if there might have been an abscessed area which drained already.  We discussed it and decided to proceed with empiric antibiotics.  I ordered a dose of Keflex  and Bactrim  in the ED and I wrote an outpatient prescription for cefadroxil  and Bactrim .  I encouraged close follow-up with either her regular doctor or with general surgery and I gave her follow-up information.  The patient's medical screening exam is reassuring with no indication of an emergent medical condition requiring hospitalization or additional evaluation at this point.  The patient is safe and appropriate for discharge and outpatient follow up.         FINAL CLINICAL IMPRESSION(S)  / ED DIAGNOSES   Final diagnoses:  Cellulitis of right breast     Rx / DC Orders   ED Discharge Orders          Ordered    cefadroxil  (DURICEF) 500 MG capsule  2 times daily,   Status:  Discontinued        08/15/23 0550    sulfamethoxazole -trimethoprim  (BACTRIM  DS) 800-160 MG tablet  2 times daily        08/15/23 0550    cefadroxil  (DURICEF) 500 MG capsule  2 times daily        08/15/23 0550             Note:  This document was prepared using Dragon voice recognition software and may include unintentional dictation errors.   Gordan Huxley, MD 08/15/23 [phone]

## 2023-08-15 NOTE — Discharge Instructions (Signed)
 Please take the full course of antibiotics as prescribed.  Consider using warm compresses on the affected area of your breast which may help with the healing.  If you are willing and able to do so, removing your piercing would be the safest plan.  Please follow-up with your regular doctor, or if you are not improving on the antibiotics, consider calling the office of Dr. Charmel Cooter to schedule a follow-up appointment with him or one of his general surgery colleagues.    Return to the emergency department if you develop new or worsening symptoms that concern you.

## 2023-08-18 ENCOUNTER — Encounter: Payer: Self-pay | Admitting: Family Medicine

## 2023-08-18 ENCOUNTER — Telehealth: Payer: Self-pay | Admitting: Family Medicine

## 2023-08-18 DIAGNOSIS — N921 Excessive and frequent menstruation with irregular cycle: Secondary | ICD-10-CM

## 2023-08-18 DIAGNOSIS — I10 Essential (primary) hypertension: Secondary | ICD-10-CM

## 2023-08-18 MED ORDER — AMLODIPINE-OLMESARTAN 10-40 MG PO TABS: 1.0000 | ORAL_TABLET | Freq: Every day | ORAL | 3 refills | Status: AC

## 2023-08-18 NOTE — Progress Notes (Signed)
 MyChart Video Visit    Virtual Visit via Video Note   This format is felt to be most appropriate for this patient at this time. Physical exam was limited by quality of the video and audio technology used for the visit.   Patient location: Patient's home address   Provider location: [facility]  [address]  [place], [place] [address]   I discussed the limitations of evaluation and management by telemedicine and the availability of in person appointments. The patient expressed understanding and agreed to proceed.  Patient: Lauren Short   DOB: February 14, 1991   33 y.o. Female  MRN: [id number] Visit Date: 08/18/2023  Today's healthcare provider: [name], MD   No chief complaint on file.  Subjective    HPI   Discussed the use of AI scribe software for clinical note transcription with the patient, who gave verbal consent to proceed.  History of Present Illness Lauren Short is a 33 year old female with hypertension who presents for a follow-up on her blood pressure management.  Her blood pressure readings at home have been consistently high, ranging between 170/119 mmHg and 190 mmHg, with the most recent reading being 175/119 mmHg on Saturday. She has not been consistent with her medication, amlodipine  10 mg, occasionally skipping doses.  She has been experiencing recurrent illnesses, including a recent infection and a bout with norovirus, which she feels may be affecting her overall health and possibly her immune system.  She has been experiencing prolonged menstrual bleeding for the past two months. She has a history of ovarian cysts, previously identified as the size of a lemon, and has been on birth control in the past to manage her symptoms. She is currently not on any hormonal treatment and is concerned about the impact of this bleeding on her fertility. No NSAIDs like ibuprofen  or Aleve have been taken to manage the  bleeding, and she is unsure of the cause of her symptoms.      Past Medical History:  Diagnosis Date   Abnormal uterine bleeding    Hypertension     Medications: Outpatient Medications Prior to Visit  Medication Sig   busPIRone  (BUSPAR ) 5 MG tablet Take 1 tablet (5 mg total) by mouth 2 (two) times daily.   cefadroxil  (DURICEF) 500 MG capsule Take 1 capsule (500 mg total) by mouth 2 (two) times daily for 5 days.   loperamide  (IMODIUM  A-D) 2 MG tablet Take 1 tablet (2 mg total) by mouth 4 (four) times daily as needed for diarrhea or loose stools.   ondansetron  (ZOFRAN -ODT) 4 MG disintegrating tablet Take 1 tablet (4 mg total) by mouth every 8 (eight) hours as needed for nausea or vomiting.   sertraline  (ZOLOFT ) 50 MG tablet Take 1 tablet (50 mg total) by mouth daily.   sulfamethoxazole -trimethoprim  (BACTRIM  DS) 800-160 MG tablet Take 1 tablet by mouth 2 (two) times daily for 5 days.   [DISCONTINUED] amLODipine  (NORVASC ) 10 MG tablet Take 1 tablet (10 mg total) by mouth daily.   No facility-administered medications prior to visit.    Review of Systems  Last metabolic panel Lab Results  Component Value Date   GLUCOSE 115 (H) 07/15/2023   NA 138 07/15/2023   K 3.7 07/15/2023   CL 101 07/15/2023   CO2 27 07/15/2023   BUN 18 07/15/2023   CREATININE 1.05 (H) 07/15/2023   GFRNONAA >60 07/15/2023   CALCIUM 9.5 07/15/2023   PROT 7.6 07/15/2023   ALBUMIN  4.2 07/15/2023   BILITOT 0.9 07/15/2023   ALKPHOS 56 07/15/2023   AST 30 07/15/2023   ALT 34 07/15/2023   ANIONGAP 10 07/15/2023        Objective    LMP 07/12/2023 (Approximate)   BP Readings from Last 3 Encounters:  08/15/23 (!) 175/119  07/15/23 (!) 159/97  12/18/22 132/84   Wt Readings from Last 3 Encounters:  08/15/23 220 lb (99.8 kg)  07/15/23 220 lb (99.8 kg)  12/18/22 206 lb 6.4 oz (93.6 kg)        Physical Exam Constitutional:      General: She is not in acute distress.    Appearance: Normal  appearance. She is not ill-appearing.  Pulmonary:     Effort: Pulmonary effort is normal. No respiratory distress.  Neurological:     Mental Status: She is alert and oriented to person, place, and time.        Assessment & Plan     Problem List Items Addressed This Visit       Cardiovascular and Mediastinum   Hypertension - Primary   Hypertension Chronic hypertension with home blood pressure readings between 170-190/119 mmHg. Current management with amlodipine  10 mg is insufficient, and adherence is inconsistent. Risks of uncontrolled hypertension include kidney damage and heart disease. Proposed addition of olmesartan  to improve blood pressure control, with potential side effects including increased potassium levels, cramping, and diarrhea. Emphasized importance of monitoring potassium and kidney function. - Schedule office visit for blood pressure evaluation and medication adjustment. - Prescribe combination therapy with amlodipine  10 mg and olmesartan  40 mg once daily. - Monitor potassium levels due to potential increase from olmesartan . - Arrange follow-up appointment within the next month for blood pressure and lab evaluation.      Relevant Medications   amLODipine -olmesartan  (AZOR ) 10-40 MG tablet     Other   Menometrorrhagia   Abnormal Uterine Bleeding Chronic, intermittent  Prolonged bleeding for two months with irregular cycles and previous ovarian cysts. Prefers to avoid hormonal treatment. Differential diagnosis includes fibroids or other structural abnormalities. - Refer to gynecology for further evaluation and management. - Advised trial of NSAIDs 400-600mg  every 6 hours  (ibuprofen , Aleve, Advil ) for 5 days to reduce bleeding.      Relevant Orders   Ambulatory referral to Gynecology     Assessment & Plan        Return in about 4 weeks (around 09/15/2023) for HTN.     I discussed the assessment and treatment plan with the patient. The patient was  provided an opportunity to ask questions and all were answered. The patient agreed with the plan and demonstrated an understanding of the instructions.   The patient was advised to call back or seek an in-person evaluation if the symptoms worsen or if the condition fails to improve as anticipated.  I provided 22 minutes of non-face-to-face time during this encounter.   [name], MD [facility] [phone] (phone) [phone] (fax)  [facility]

## 2023-08-18 NOTE — Assessment & Plan Note (Signed)
 Abnormal Uterine Bleeding Chronic, intermittent  Prolonged bleeding for two months with irregular cycles and previous ovarian cysts. Prefers to avoid hormonal treatment. Differential diagnosis includes fibroids or other structural abnormalities. - Refer to gynecology for further evaluation and management. - Advised trial of NSAIDs 400-600mg  every 6 hours  (ibuprofen , Aleve, Advil ) for 5 days to reduce bleeding.

## 2023-08-18 NOTE — Assessment & Plan Note (Signed)
 Hypertension Chronic hypertension with home blood pressure readings between 170-190/119 mmHg. Current management with amlodipine  10 mg is insufficient, and adherence is inconsistent. Risks of uncontrolled hypertension include kidney damage and heart disease. Proposed addition of olmesartan  to improve blood pressure control, with potential side effects including increased potassium levels, cramping, and diarrhea. Emphasized importance of monitoring potassium and kidney function. - Schedule office visit for blood pressure evaluation and medication adjustment. - Prescribe combination therapy with amlodipine  10 mg and olmesartan  40 mg once daily. - Monitor potassium levels due to potential increase from olmesartan . - Arrange follow-up appointment within the next month for blood pressure and lab evaluation.

## 2023-08-18 NOTE — Patient Instructions (Signed)
 VISIT SUMMARY:  During your visit, we discussed your ongoing issues with high blood pressure and prolonged menstrual bleeding. We reviewed your current medications and symptoms, and we have made some adjustments to your treatment plan to better manage your conditions.  YOUR PLAN:  -ABNORMAL UTERINE BLEEDING: You have been experiencing prolonged menstrual bleeding for the past two months, which may be due to fibroids or other structural issues. We recommend you see a gynecologist for further evaluation. In the meantime, you can try taking NSAIDs 400-600mg  every 6 hours( like ibuprofen , Aleve, or Advil  )for 5 days to help reduce the bleeding.  -HYPERTENSION: Your blood pressure readings at home have been consistently high, which can lead to serious health issues like kidney damage and heart disease. We are adding a new medication, olmesartan  40 mg, to your current amlodipine  10 mg to help control your blood pressure. It's important to take your medications consistently. We will also monitor your potassium levels and kidney function, as olmesartan  can affect these. Please schedule a follow-up appointment within the next month for a blood pressure check and lab tests.  INSTRUCTIONS:  Please schedule an appointment with a gynecologist for further evaluation of your prolonged menstrual bleeding. Also, arrange a follow-up visit within the next month for a blood pressure check and lab tests to monitor your potassium levels and kidney function.

## 2023-09-15 ENCOUNTER — Telehealth: Payer: Self-pay

## 2023-09-15 NOTE — Telephone Encounter (Signed)
 Copied from CRM [id number]. Topic: Clinical - Medication Question >> Sep 15, 2023  3:50 PM Crispin Dolphin wrote: Reason for CRM: Patient called - wanted to leave message for provider that new blood pressure medication is working fine. She is down to the 150s. Thank You

## 2023-09-16 NOTE — Telephone Encounter (Signed)
 Reviewed. Recommend follow up in next 4-6 weeks for office visit, goal for blood pressure is less than 140/80

## 2023-09-17 ENCOUNTER — Telehealth: Payer: Self-pay

## 2023-09-17 NOTE — Telephone Encounter (Signed)
 Copied from CRM [id number]. Topic: General - Other >> Sep 16, 2023  5:17 PM Star East wrote: Reason for CRM: Patient returned call, gave message from Dr Saralee Cummins verbatim, will call back to schedule follow up visit

## 2023-09-17 NOTE — Telephone Encounter (Signed)
 Per the last encounter she was informed of her lab results and stated she would contact the office back at a later time to get scheduled

## 2023-09-29 NOTE — Progress Notes (Signed)
 Lauren Alt, MD   Chief Complaint  Patient presents with   Vaginal Bleeding    Has been bleeding since March 2025. Heavy with blood clots. No abnormal pain.   Urinary Tract Infection    Frequent and pain/discomfort urinating    HPI:      [name] is a 33 y.o. G0P0000 whose LMP was Patient's last menstrual period was 06/29/2023 (approximate)., presents today for eval menorrhagia, referred by PCP.  Menses are Q1-3 months and lasting 14 days since menarche, mod flow with small clots, no BTB, no dysmen. Did sprintec OCPs in past for cycle control with good relief. Pt has some chin hairs/occas acne. Started bleeding 3/25 during sex and has persisted daily since; same mod flow, no dysmen. Did have pain with sex when bleeding started, but doesn't usually have pain/bleeding with sex. She is not on [facility], does have new partner.  Pt would like to conceive; has never been pregnant; doesn't want BC but knows it regulates her cycles. Hx of HTN; given POPs 6/23 with Dr. Nicolette Barrio; pt thinks she took them but doesn't remember anything particular about them.   Normal H/H 3/25; no PCOS labs, no recent GYN u/s.  Neg pap/neg HPV DNA 10/22  Also with urinary frequency/dysuria past 2 wks. No LBP, pelvic pain, fevers, vag sx. Drinks lots of sodas, little water.   Patient Active Problem List   Diagnosis Date Noted   Annual physical exam 03/24/2023   Anxiety 12/04/2022   Morbid obesity (HCC) 210 lbs 09/19/2022   Vapes nicotine containing substance 09/19/2022   Hypertension 05/10/2021   Menometrorrhagia 08/20/2016    Past Surgical History:  Procedure Laterality Date   NO PAST SURGERIES      Family History  Problem Relation Age of Onset   Stroke Mother        paralyzed   Hypertension Mother    Aneurysm Father 73 - 37   Hypertension Sister    Hypertension Sister    Lupus Paternal Aunt     Social History   Socioeconomic History   Marital status: Single    Spouse  name: Not on file   Number of children: Not on file   Years of education: Not on file   Highest education level: Not on file  Occupational History   Not on file  Tobacco Use   Smoking status: Former    Types: E-cigarettes, Cigars   Smokeless tobacco: Never  Vaping Use   Vaping status: Every Day   Substances: Nicotine, Flavoring  Substance and Sexual Activity   Alcohol use: Yes    Alcohol/week: 4.0 standard drinks of alcohol    Types: 4 Shots of liquor per week    Comment: soc   Drug use: Not Currently    Types: Marijuana    Comment: last use age 29   Sexual activity: Yes    Partners: Male    Birth control/protection: None  Other Topics Concern   Not on file  Social History Narrative   Not on file   Social Drivers of Health   Financial Resource Strain: Not on file  Food Insecurity: Not on file  Transportation Needs: Not on file  Physical Activity: Not on file  Stress: Not on file  Social Connections: Not on file  Intimate Partner Violence: Not At Risk (05/06/2022)   Humiliation, Afraid, Rape, and Kick questionnaire    Fear of Current or Ex-Partner: No    Emotionally Abused: No  Physically Abused: No    Sexually Abused: No    Outpatient Medications Prior to Visit  Medication Sig Dispense Refill   amLODipine -olmesartan  (AZOR ) 10-40 MG tablet Take 1 tablet by mouth daily. 90 tablet 3   busPIRone  (BUSPAR ) 5 MG tablet Take 1 tablet (5 mg total) by mouth 2 (two) times daily. (Patient not taking: Reported on 09/30/2023) 60 tablet 0   sertraline  (ZOLOFT ) 50 MG tablet Take 1 tablet (50 mg total) by mouth daily. (Patient not taking: Reported on 09/30/2023) 30 tablet 0   loperamide  (IMODIUM  A-D) 2 MG tablet Take 1 tablet (2 mg total) by mouth 4 (four) times daily as needed for diarrhea or loose stools. 30 tablet 0   ondansetron  (ZOFRAN -ODT) 4 MG disintegrating tablet Take 1 tablet (4 mg total) by mouth every 8 (eight) hours as needed for nausea or vomiting. 12 tablet 0   No  facility-administered medications prior to visit.      ROS:  Review of Systems  Constitutional:  Negative for fever.  Gastrointestinal:  Negative for blood in stool, constipation, diarrhea, nausea and vomiting.  Genitourinary:  Positive for dysuria, frequency and menstrual problem. Negative for dyspareunia, flank pain, hematuria, urgency, vaginal bleeding, vaginal discharge and vaginal pain.  Musculoskeletal:  Negative for back pain.  Skin:  Negative for rash.   BREAST: No symptoms   OBJECTIVE:   Vitals:  BP 133/84   Pulse 66   Ht 5\' 7"  (1.702 m)   Wt 216 lb (98 kg)   LMP 06/29/2023 (Approximate)   BMI 33.83 kg/m   Physical Exam Vitals reviewed.  Constitutional:      Appearance: She is well-developed.  Pulmonary:     Effort: Pulmonary effort is normal.  Genitourinary:    General: Normal vulva.     Pubic Area: No rash.      Labia:        Right: No rash, tenderness or lesion.        Left: No rash, tenderness or lesion.      Vagina: Bleeding present. No vaginal discharge, erythema or tenderness.     Cervix: Normal.     Uterus: Normal. Not enlarged and not tender.      Adnexa: Right adnexa normal and left adnexa normal.       Right: No mass or tenderness.         Left: No mass or tenderness.    Musculoskeletal:        General: Normal range of motion.     Cervical back: Normal range of motion.  Skin:    General: Skin is warm and dry.  Neurological:     General: No focal deficit present.     Mental Status: She is alert and oriented to person, place, and time.  Psychiatric:        Mood and Affect: Mood normal.        Behavior: Behavior normal.        Thought Content: Thought content normal.        Judgment: Judgment normal.     Results: Results for orders placed or performed in visit on 09/30/23 (from the past 24 hours)  POCT urinalysis dipstick     Status: Abnormal   Collection Time: 09/30/23  4:32 PM  Result Value Ref Range   Color, UA yellow    Clarity,  UA clear    Glucose, UA Negative Negative   Bilirubin, UA neg    Ketones, UA neg    Spec Grav, UA  1.020 1.010 - 1.025   Blood, UA large    pH, UA 5.0 5.0 - 8.0   Protein, UA Negative Negative   Urobilinogen, UA     Nitrite, UA neg    Leukocytes, UA Negative Negative   Appearance     Odor    PT HAVING VAG BLEEDING   Assessment/Plan: Abnormal uterine bleeding (AUB) - Plan: CBC with Differential/Platelet, US  PELVIS TRANSVAGINAL NON-OB (TV ONLY); daily bleeding since 3/25; check labs, rule out STDs and GYN u/s. Will f/u with results. If labs WNL, will start aygestin  to try to stop bleeding.   Screening for STD (sexually transmitted disease) - Plan: Cytology - PAP  Cervical cancer screening - Plan: Cytology - PAP  Screening for HPV (human papillomavirus) - Plan: Cytology - PAP  Secondary amenorrhea - Plan: FSH/LH, Estradiol , DHEA-sulfate, Androstenedione, 17-Hydroxyprogesterone, Progesterone, Prolactin, Testosterone,Free and Total, TSH; check labs for PCOS. Pt wants to conceive.   Infertility counseling  UTI symptoms - Plan: Urine Culture, POCT urinalysis dipstick; neg UA. Check C&S. D/C sodas, increase water.     Return in about 3 days (around 10/03/2023) for GYN u/s for AUB--[facility] to call pt.  Jossalyn Forgione B. Amany Rando, PA-C 09/30/2023 4:47 PM

## 2023-09-30 ENCOUNTER — Other Ambulatory Visit (HOSPITAL_COMMUNITY)
Admission: RE | Admit: 2023-09-30 | Discharge: 2023-09-30 | Disposition: A | Discharge status: Home / Self Care | Payer: Self-pay | Source: Ambulatory Visit | Attending: Obstetrics and Gynecology | Admitting: Obstetrics and Gynecology

## 2023-09-30 ENCOUNTER — Ambulatory Visit (INDEPENDENT_AMBULATORY_CARE_PROVIDER_SITE_OTHER): Payer: Self-pay | Admitting: Obstetrics and Gynecology

## 2023-09-30 ENCOUNTER — Encounter: Payer: Self-pay | Admitting: Obstetrics and Gynecology

## 2023-09-30 VITALS — BP 133/84 | HR 66 | Ht 67.0 in | Wt 216.0 lb

## 2023-09-30 DIAGNOSIS — Z113 Encounter for screening for infections with a predominantly sexual mode of transmission: Secondary | ICD-10-CM

## 2023-09-30 DIAGNOSIS — N911 Secondary amenorrhea: Secondary | ICD-10-CM

## 2023-09-30 DIAGNOSIS — Z3169 Encounter for other general counseling and advice on procreation: Secondary | ICD-10-CM

## 2023-09-30 DIAGNOSIS — Z124 Encounter for screening for malignant neoplasm of cervix: Secondary | ICD-10-CM | POA: Insufficient documentation

## 2023-09-30 DIAGNOSIS — N939 Abnormal uterine and vaginal bleeding, unspecified: Secondary | ICD-10-CM

## 2023-09-30 DIAGNOSIS — Z1151 Encounter for screening for human papillomavirus (HPV): Secondary | ICD-10-CM

## 2023-09-30 DIAGNOSIS — R399 Unspecified symptoms and signs involving the genitourinary system: Secondary | ICD-10-CM

## 2023-09-30 LAB — POCT URINALYSIS DIPSTICK
Bilirubin, UA: NEGATIVE
Glucose, UA: NEGATIVE
Ketones, UA: NEGATIVE
Leukocytes, UA: NEGATIVE
Nitrite, UA: NEGATIVE
Protein, UA: NEGATIVE
Spec Grav, UA: 1.02 (ref 1.010–1.025)
pH, UA: 5 (ref 5.0–8.0)

## 2023-09-30 NOTE — Patient Instructions (Signed)
 I value your feedback and you entrusting [place] with your care. If you get a [facility] [place] [facility] patient survey, I would appreciate you taking the time to let us know about your experience today. Thank you! ? ? ?

## 2023-10-02 ENCOUNTER — Ambulatory Visit: Payer: Self-pay | Admitting: Obstetrics and Gynecology

## 2023-10-02 LAB — URINE CULTURE

## 2023-10-03 ENCOUNTER — Other Ambulatory Visit: Payer: Self-pay

## 2023-10-04 LAB — CBC WITH DIFFERENTIAL/PLATELET
Basophils Absolute: 0 10*3/uL (ref 0.0–0.2)
Basos: 1 %
EOS (ABSOLUTE): 0.1 10*3/uL (ref 0.0–0.4)
Eos: 1 %
Hematocrit: 42 % (ref 34.0–46.6)
Hemoglobin: 13.7 g/dL (ref 11.1–15.9)
Immature Grans (Abs): 0 10*3/uL (ref 0.0–0.1)
Immature Granulocytes: 0 %
Lymphocytes Absolute: 1.5 10*3/uL (ref 0.7–3.1)
Lymphs: 26 %
MCH: 27 pg (ref 26.6–33.0)
MCHC: 32.6 g/dL (ref 31.5–35.7)
MCV: 83 fL (ref 79–97)
Monocytes Absolute: 0.5 10*3/uL (ref 0.1–0.9)
Monocytes: 8 %
Neutrophils Absolute: 3.7 10*3/uL (ref 1.4–7.0)
Neutrophils: 64 %
Platelets: 289 10*3/uL (ref 150–450)
RBC: 5.07 x10E6/uL (ref 3.77–5.28)
RDW: 14.9 % (ref 11.7–15.4)
WBC: 5.8 10*3/uL (ref 3.4–10.8)

## 2023-10-04 LAB — PROLACTIN: Prolactin: 17.9 ng/mL (ref 4.8–33.4)

## 2023-10-04 LAB — TSH: TSH: 1.93 u[IU]/mL (ref 0.450–4.500)

## 2023-10-04 LAB — TESTOSTERONE,FREE AND TOTAL
Testosterone, Free: 6 pg/mL — ABNORMAL HIGH (ref 0.0–4.2)
Testosterone: 66 ng/dL — ABNORMAL HIGH (ref 8–60)

## 2023-10-04 LAB — 17-HYDROXYPROGESTERONE: 17-OH Progesterone LCMS: 76 ng/dL

## 2023-10-04 LAB — DHEA-SULFATE: DHEA-SO4: 253 ug/dL (ref 84.8–378.0)

## 2023-10-04 LAB — ESTRADIOL: Estradiol: 67.3 pg/mL

## 2023-10-04 LAB — FSH/LH
FSH: 4.4 m[IU]/mL
LH: 8.2 m[IU]/mL

## 2023-10-04 LAB — ANDROSTENEDIONE: Androstenedione LCMS: 324 ng/dL — ABNORMAL HIGH (ref 41–262)

## 2023-10-04 LAB — PROGESTERONE: Progesterone: 0.1 ng/mL

## 2023-10-05 MED ORDER — NORETHINDRONE ACETATE 5 MG PO TABS
5.0000 mg | ORAL_TABLET | Freq: Every day | ORAL | 0 refills | Status: AC
Start: 2023-10-05 — End: 2023-10-15

## 2023-10-05 NOTE — Addendum Note (Signed)
 Addended by: Alyn Judge B on: 10/05/2023 08:30 PM   Modules accepted: Orders

## 2023-10-08 LAB — CYTOLOGY - PAP
Adequacy: ABNORMAL
Chlamydia: NEGATIVE
Comment: NEGATIVE
Comment: NEGATIVE
Comment: NORMAL
Neisseria Gonorrhea: NEGATIVE

## 2023-10-20 NOTE — Telephone Encounter (Signed)
 Patient states she is still bleeding and it seems to be heavier. Secure chat with Lauren Short:  Patient aware. She states she scheduled the u/s for next month due to inability to take off work til then. She will call when she has completed the Aygestin  to give an update.

## 2023-10-20 NOTE — Telephone Encounter (Signed)
 TRIAGE VOICEMAIL: Patient states Bernarda put her on a trial medicine for ten days. She states it's doing no good.

## 2023-10-28 NOTE — Telephone Encounter (Signed)
 TRIAGE VOICEMAIL: Patient states she's been bleeding since March. She has called several times to advise the Aygestin  is not working. She doubled it as last advised. She would like a return call to advise of next steps.

## 2023-10-31 NOTE — Progress Notes (Signed)
 Has the bleeding slowed at all? I know she has limited time to get u/s due to new job but I don't know what I'm treating for without the ultrasound. The only thing she can keep doing is the aygestin  10 mg.

## 2023-11-02 NOTE — Progress Notes (Signed)
 Or she can stop the pills altogether and we can see what her bleeding does. Thx.

## 2023-11-06 NOTE — Progress Notes (Signed)
 Ok

## 2023-11-18 ENCOUNTER — Other Ambulatory Visit: Payer: Self-pay

## 2023-11-18 ENCOUNTER — Telehealth: Payer: Self-pay | Admitting: Obstetrics and Gynecology

## 2023-11-18 NOTE — Telephone Encounter (Signed)
 Reached out to pt to reschedule gyn US  that was scheduled on 11/18/2023 at 4:00.  Was able to reschedule the US  to 12/10/2023 at 4:00.

## 2023-12-09 ENCOUNTER — Other Ambulatory Visit: Payer: Self-pay | Admitting: Obstetrics and Gynecology

## 2023-12-09 DIAGNOSIS — N939 Abnormal uterine and vaginal bleeding, unspecified: Secondary | ICD-10-CM

## 2023-12-10 ENCOUNTER — Ambulatory Visit (INDEPENDENT_AMBULATORY_CARE_PROVIDER_SITE_OTHER): Payer: Self-pay

## 2023-12-10 DIAGNOSIS — N939 Abnormal uterine and vaginal bleeding, unspecified: Secondary | ICD-10-CM

## 2023-12-10 NOTE — Telephone Encounter (Signed)
 Pt came in office for US  and is asking for a RF of Rx that will help with the bleeding. Recently had a cycle and 4 days ago she started bleeding again, not super heavy but she has the feeling the irregular bleeding is happening again and would like to avoid bleeding for months. Advised you were out of office and is okay waiting for reply.

## 2023-12-11 ENCOUNTER — Encounter: Payer: Self-pay | Admitting: Obstetrics and Gynecology

## 2024-01-20 ENCOUNTER — Other Ambulatory Visit: Payer: Self-pay

## 2024-01-20 ENCOUNTER — Emergency Department
Admission: EM | Admit: 2024-01-20 | Discharge: 2024-01-20 | Disposition: A | Discharge status: Home / Self Care | Payer: Self-pay | Attending: Emergency Medicine | Admitting: Emergency Medicine

## 2024-01-20 DIAGNOSIS — Z202 Contact with and (suspected) exposure to infections with a predominantly sexual mode of transmission: Secondary | ICD-10-CM

## 2024-01-20 DIAGNOSIS — N760 Acute vaginitis: Secondary | ICD-10-CM | POA: Insufficient documentation

## 2024-01-20 DIAGNOSIS — B9689 Other specified bacterial agents as the cause of diseases classified elsewhere: Secondary | ICD-10-CM | POA: Insufficient documentation

## 2024-01-20 LAB — WET PREP, GENITAL
Sperm: NONE SEEN
Trich, Wet Prep: NONE SEEN
WBC, Wet Prep HPF POC: <10 (ref <10)
Yeast Wet Prep HPF POC: NONE SEEN

## 2024-01-20 LAB — URINALYSIS, ROUTINE W REFLEX MICROSCOPIC
Bilirubin Urine: NEGATIVE
Glucose, UA: NEGATIVE mg/dL
Hgb urine dipstick: NEGATIVE
Ketones, ur: NEGATIVE mg/dL
Leukocytes,Ua: NEGATIVE
Nitrite: NEGATIVE
Protein, ur: NEGATIVE mg/dL
Specific Gravity, Urine: 1.028 (ref 1.005–1.030)
pH: 5 (ref 5.0–8.0)

## 2024-01-20 LAB — CHLAMYDIA/NGC RT PCR (ARMC ONLY)
Chlamydia Tr: NOT DETECTED
N gonorrhoeae: NOT DETECTED

## 2024-01-20 LAB — POC URINE PREG, ED: Preg Test, Ur: NEGATIVE

## 2024-01-20 MED ORDER — METRONIDAZOLE 500 MG PO TABS: 500.0000 mg | ORAL_TABLET | Freq: Two times a day (BID) | ORAL | 0 refills | Status: AC

## 2024-01-20 MED ORDER — FLUCONAZOLE 150 MG PO TABS: 150.0000 mg | ORAL_TABLET | Freq: Every day | ORAL | 0 refills | Status: AC

## 2024-01-20 NOTE — ED Provider Notes (Signed)
 [facility] Emergency Department Provider Note     Event Date/Time   First MD Initiated Contact with Patient 01/20/24 2035     (approximate)   History   Exposure to STD   HPI  Lauren Short is a 33 y.o. female with a past medical history of STD presents to the ED for evaluation of STD screening.  Patient reports her significant other may be HIV positive.  She is asymptomatic currently, but requesting to be tested for everything. Endorse vaginal itching, however his is not a new symptom.  Denies dysuria, vaginal bleeding, vaginal discharge, urinary frequency, lesions or rashes.    Physical Exam   Triage Vital Signs: ED Triage Vitals  Encounter Vitals Group     BP 01/20/24 1853 (!) 183/125     Girls Systolic BP Percentile --      Girls Diastolic BP Percentile --      Boys Systolic BP Percentile --      Boys Diastolic BP Percentile --      Pulse Rate 01/20/24 1853 89     Resp 01/20/24 1853 18     Temp 01/20/24 1853 98 F (36.7 C)     Temp src --      SpO2 01/20/24 1853 97 %     Weight 01/20/24 1851 220 lb (99.8 kg)     Height 01/20/24 1851 5' 7 (1.702 m)     Head Circumference --      Peak Flow --      Pain Score 01/20/24 1851 0     Pain Loc --      Pain Education --      Exclude from Growth Chart --     Most recent vital signs: Vitals:   01/20/24 1853  BP: (!) 183/125  Pulse: 89  Resp: 18  Temp: 98 F (36.7 C)  SpO2: 97%   General Awake, no distress.  HEENT NCAT.  CV:  Good peripheral perfusion.  RESP:  Normal effort.  ABD:  No distention.   ED Results / Procedures / Treatments   Labs (all labs ordered are listed, but only abnormal results are displayed) Labs Reviewed  WET PREP, GENITAL - Abnormal; Notable for the following components:      Result Value   Clue Cells Wet Prep HPF POC PRESENT (*)    All other components within normal limits  URINALYSIS, ROUTINE W REFLEX MICROSCOPIC - Abnormal; Notable for the  following components:   Color, Urine YELLOW (*)    APPearance CLOUDY (*)    All other components within normal limits  CHLAMYDIA/NGC RT PCR ([facility] ONLY)            RPR  HIV ANTIBODY (ROUTINE TESTING W REFLEX)  POC URINE PREG, ED   No results found.  PROCEDURES:  Critical Care performed: No  Procedures   MEDICATIONS ORDERED IN ED: Medications - No data to display   IMPRESSION / MDM / ASSESSMENT AND PLAN / ED COURSE  I reviewed the triage vital signs and the nursing notes.                               33 y.o. female presents to the emergency department for evaluation and treatment of STD screening. See HPI for further details.   Differential diagnosis includes, but is not limited to chlamydia, gonorrhea, UTI, HIV, syphilis  Patient's presentation is most consistent with acute complicated  illness / injury requiring diagnostic workup.  Patient is alert and oriented.  She is hemodynamic stable.  Noted elevated blood pressure of 183/125.  Patient admits to not taking her daily hypertension medication.  Physical exam finding is normal.   Wet prep is positive for clue cells.  Will treat with Flagyl .  Patient endorses being prone to yeast infections.  Will send Diflucan  to pharmacy.  Chlamydia, RPR, and HIV test pending.  Patient is advised if these results are positive she will receive a phone call.  Also encouraged to review MyChart for results.  She verbalized understanding.  She is in stable condition for discharge home.  I have advised follow-up with health department for further education and refrain from sexual activity until test results return.  Patient stable condition for discharge home.  ED precautions discussed. All questions and concerns were addressed during this ED visit.    FINAL CLINICAL IMPRESSION(S) / ED DIAGNOSES   Final diagnoses:  Possible exposure to STD  BV (bacterial vaginosis)   Rx / DC Orders   ED Discharge Orders          Ordered    metroNIDAZOLE   (FLAGYL ) 500 MG tablet  2 times daily        01/20/24 2252    fluconazole  (DIFLUCAN ) 150 MG tablet  Daily        01/20/24 2252           Note:  This document was prepared using Dragon voice recognition software and may include unintentional dictation errors.    Margrette, Krew Hortman A, PA-C 01/20/24 7698    Dorothyann Drivers, MD 01/23/24 1247

## 2024-01-20 NOTE — Discharge Instructions (Addendum)
 You were evaluated in the ED for STD screening.  Your urinalysis is normal.  Your wet prep is positive for BV which we will treat with Flagyl .  Please take as instructed.  Diflucan  which is a antiyeast medication has been sent to your pharmacy.  Take 1 pill at the start of your antibiotics and the second pill at the end.  Your chlamydia gonorrhea is pending  Your HIV test is pending  Your RPR for syphilis is pending  If any of these results are positive you will receive a phone call.  Also you can check through your MyChart.  Normally these results can take up to 3 days before being resulted.  In the interim, please follow-up with the health department or your primary care provider for further evaluation and education on protecting yourself from sexual transmitted diseases.  Please take your blood pressure medication as prescribed.

## 2024-01-20 NOTE — ED Triage Notes (Signed)
 Pt to ED for exposure to HIV. Reports vaginal itching Reports did not take BP meds today

## 2024-01-21 LAB — HIV ANTIBODY (ROUTINE TESTING W REFLEX): HIV Screen 4th Generation wRfx: NONREACTIVE

## 2024-01-21 LAB — RPR: RPR Ser Ql: NONREACTIVE

## 2024-01-28 ENCOUNTER — Ambulatory Visit: Payer: Self-pay

## 2024-02-04 ENCOUNTER — Other Ambulatory Visit: Payer: Self-pay | Admitting: Family Medicine

## 2024-02-04 DIAGNOSIS — F419 Anxiety disorder, unspecified: Secondary | ICD-10-CM

## 2024-02-04 NOTE — Telephone Encounter (Unsigned)
 Copied from CRM #[id number]. Topic: Clinical - Medication Refill >> Feb 04, 2024 10:40 AM Emylou G wrote: Medication: busPIRone  (BUSPAR ) 5 MG tablet sertraline  (ZOLOFT ) 50 MG tablet   Has the patient contacted their pharmacy? Yes (Agent: If no, request that the patient contact the pharmacy for the refill. If patient does not wish to contact the pharmacy document the reason why and proceed with request.) (Agent: If yes, when and what did the pharmacy advise?) said to call us   This is the patient's preferred pharmacy:  [facility] [address], [place] - [address] [place] [address] Phone: [phone] Fax: [phone]  Is this the correct pharmacy for this prescription? Yes If no, delete pharmacy and type the correct one.   Has the prescription been filled recently? No  Is the patient out of the medication? Yes  Has the patient been seen for an appointment in the last year OR does the patient have an upcoming appointment? Yes  Can we respond through MyChart? Yes  Agent: Please be advised that Rx refills may take up to 3 business days. We ask that you follow-up with your pharmacy.

## 2024-02-05 ENCOUNTER — Telehealth (INDEPENDENT_AMBULATORY_CARE_PROVIDER_SITE_OTHER): Payer: Self-pay

## 2024-02-05 DIAGNOSIS — F419 Anxiety disorder, unspecified: Secondary | ICD-10-CM

## 2024-02-05 MED ORDER — BUSPIRONE HCL 5 MG PO TABS: 5.0000 mg | ORAL_TABLET | Freq: Three times a day (TID) | ORAL | 3 refills | Status: AC

## 2024-02-05 MED ORDER — SERTRALINE HCL 100 MG PO TABS: 100.0000 mg | ORAL_TABLET | Freq: Every day | ORAL | 3 refills | Status: AC
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
## 2024-03-10 ENCOUNTER — Telehealth (INDEPENDENT_AMBULATORY_CARE_PROVIDER_SITE_OTHER): Payer: Self-pay

## 2024-03-10 DIAGNOSIS — F419 Anxiety disorder, unspecified: Secondary | ICD-10-CM

## 2024-03-10 NOTE — Progress Notes (Signed)
    MyChart Video Visit    Virtual Visit via Video Note   This format is felt to be most appropriate for this patient at this time. Physical exam was limited by quality of the video and audio technology used for the visit.    Patient location: home Provider location: [facility] Persons involved in the visit: patient, provider  I discussed the limitations of evaluation and management by telemedicine and the availability of in person appointments. The patient expressed understanding and agreed to proceed.  Patient: Lauren Short   DOB: 26-Jul-1990   33 y.o. Female  MRN: [id number] Visit Date: 03/10/2024  Today's healthcare provider: Isaiah DELENA Pepper, MD   Chief complaint: mood follow up  Subjective    HPI   Discussed the use of AI scribe software for clinical note transcription with the patient, who gave verbal consent to proceed.  History of Present Illness Lauren Short is a 33 year old female with depression, anxiety, and hypertension who presents for follow up.  Overall, her mental health has improved since taking her Zoloft  and Buspar . She typically takes her medications, including Buspar , Zoloft  (sertraline ), and blood pressure medication, around midday, especially when anticipating social interactions or crowds. However, she has been inconsistent with her medication regimen, missing doses of Zoloft  and skipping Buspar  for two or three days at a time.  She reports feeling calmer and more patient despite losing a job recently. She no longer gets upset as she used to. She attributes some improvement in her anxiety to the medication, although she has been less socially active.  Review of systems as noted in HPI.      Objective    There were no vitals taken for this visit.      Physical Exam HENT:     Head: Normocephalic.  Eyes:     Extraocular Movements: Extraocular movements intact.  Pulmonary:     Effort: Pulmonary effort is normal.   Neurological:     Mental Status: She is alert.  Psychiatric:        Mood and Affect: Mood normal.        Assessment & Plan     Problem List Items Addressed This Visit       Other   Anxiety - Primary   Assessment & Plan Anxiety  Chronic, improving. Anxiety and depression improved since increasing Zoloft  and Buspar . Increased sleepiness possibly due to medication timing. Missed sertraline  doses may affect efficacy. - Continue Buspirone  5 mg oral TID - Prioritize taking sertraline  daily, can take at night, to reduce daytime sleepiness. - Monitor for changes in symptoms or side effects.     No orders of the defined types were placed in this encounter.    No follow-ups on file.     I discussed the assessment and treatment plan with the patient. The patient was provided an opportunity to ask questions and all were answered. The patient agreed with the plan and demonstrated an understanding of the instructions.   The patient was advised to call back or seek an in-person evaluation if the symptoms worsen or if the condition fails to improve as anticipated.  I personally spent a total of 20 minutes in the care of the patient today including preparing to see the patient, getting/reviewing separately obtained history, performing a medically appropriate exam/evaluation, counseling and educating, and documenting clinical information in the EHR.   Isaiah DELENA Pepper, MD [facility] [phone] (phone) [phone] (fax)
